# Patient Record
Sex: Female | Born: 1965 | Race: Black or African American | Hispanic: No | Smoking: Current some day smoker
Health system: Southern US, Community
[De-identification: ages and names within clinical notes are randomized; demographics above are authoritative.]

## PROBLEM LIST (undated history)

## (undated) DIAGNOSIS — R7303 Prediabetes: Secondary | ICD-10-CM

## (undated) DIAGNOSIS — M1711 Unilateral primary osteoarthritis, right knee: Secondary | ICD-10-CM

## (undated) DIAGNOSIS — R519 Headache, unspecified: Secondary | ICD-10-CM

## (undated) DIAGNOSIS — M2550 Pain in unspecified joint: Secondary | ICD-10-CM

## (undated) DIAGNOSIS — R51 Headache: Secondary | ICD-10-CM

## (undated) DIAGNOSIS — I10 Essential (primary) hypertension: Secondary | ICD-10-CM

## (undated) HISTORY — PX: NECK SURGERY: SHX720

## (undated) HISTORY — PX: CHOLECYSTECTOMY: SHX55

## (undated) HISTORY — PX: COLONOSCOPY: SHX174

## (undated) HISTORY — PX: BACK SURGERY: SHX140

## (undated) HISTORY — PX: OOPHORECTOMY: SHX86

## (undated) HISTORY — PX: ABDOMINAL HYSTERECTOMY: SHX81

---

## 2005-01-06 ENCOUNTER — Ambulatory Visit: Payer: Self-pay | Admitting: Family Medicine

## 2005-03-15 ENCOUNTER — Encounter: Payer: Self-pay | Admitting: Orthopedic Surgery

## 2005-04-13 ENCOUNTER — Encounter: Payer: Self-pay | Admitting: Orthopedic Surgery

## 2005-05-13 ENCOUNTER — Encounter: Payer: Self-pay | Admitting: Orthopedic Surgery

## 2006-02-27 ENCOUNTER — Ambulatory Visit: Payer: Self-pay | Admitting: Family Medicine

## 2007-04-04 ENCOUNTER — Ambulatory Visit: Payer: Self-pay | Admitting: Family Medicine

## 2008-04-06 ENCOUNTER — Ambulatory Visit: Payer: Self-pay | Admitting: Family Medicine

## 2008-04-10 ENCOUNTER — Ambulatory Visit: Payer: Self-pay | Admitting: Obstetrics and Gynecology

## 2008-04-16 ENCOUNTER — Inpatient Hospital Stay: Payer: Self-pay | Admitting: Obstetrics and Gynecology

## 2009-04-08 ENCOUNTER — Ambulatory Visit: Payer: Self-pay | Admitting: Family Medicine

## 2010-04-27 ENCOUNTER — Ambulatory Visit: Payer: Self-pay | Admitting: Family Medicine

## 2011-05-30 ENCOUNTER — Ambulatory Visit: Payer: Self-pay | Admitting: Family Medicine

## 2012-06-06 ENCOUNTER — Ambulatory Visit: Payer: Self-pay | Admitting: Family Medicine

## 2012-08-15 ENCOUNTER — Ambulatory Visit: Payer: Self-pay | Admitting: Orthopedic Surgery

## 2012-10-09 ENCOUNTER — Ambulatory Visit: Payer: Self-pay | Admitting: Orthopedic Surgery

## 2013-06-10 ENCOUNTER — Ambulatory Visit: Payer: Self-pay | Admitting: Family Medicine

## 2013-06-23 ENCOUNTER — Ambulatory Visit: Payer: Self-pay | Admitting: Family Medicine

## 2014-02-06 ENCOUNTER — Ambulatory Visit: Payer: Self-pay | Admitting: Family Medicine

## 2014-06-18 ENCOUNTER — Ambulatory Visit: Payer: Self-pay | Admitting: Family Medicine

## 2014-07-15 ENCOUNTER — Ambulatory Visit: Payer: Self-pay | Admitting: Orthopedic Surgery

## 2015-05-12 ENCOUNTER — Other Ambulatory Visit: Payer: Self-pay | Admitting: Family Medicine

## 2015-05-12 DIAGNOSIS — Z1231 Encounter for screening mammogram for malignant neoplasm of breast: Secondary | ICD-10-CM

## 2015-06-21 ENCOUNTER — Ambulatory Visit
Admission: RE | Admit: 2015-06-21 | Discharge: 2015-06-21 | Disposition: A | Discharge status: Home / Self Care | Payer: BC Managed Care – PPO | Source: Ambulatory Visit | Attending: Family Medicine | Admitting: Family Medicine

## 2015-06-21 DIAGNOSIS — Z1231 Encounter for screening mammogram for malignant neoplasm of breast: Secondary | ICD-10-CM | POA: Diagnosis present

## 2016-04-05 ENCOUNTER — Ambulatory Visit
Admission: RE | Admit: 2016-04-05 | Discharge: 2016-04-05 | Disposition: A | Discharge status: Home / Self Care | Payer: BC Managed Care – PPO | Source: Ambulatory Visit | Attending: Orthopedic Surgery | Admitting: Orthopedic Surgery

## 2016-04-05 ENCOUNTER — Other Ambulatory Visit: Payer: Self-pay | Admitting: Orthopedic Surgery

## 2016-04-05 DIAGNOSIS — G8929 Other chronic pain: Secondary | ICD-10-CM | POA: Diagnosis not present

## 2016-04-05 DIAGNOSIS — M25561 Pain in right knee: Secondary | ICD-10-CM | POA: Diagnosis not present

## 2016-04-05 DIAGNOSIS — M25562 Pain in left knee: Secondary | ICD-10-CM | POA: Insufficient documentation

## 2016-04-05 DIAGNOSIS — M25569 Pain in unspecified knee: Secondary | ICD-10-CM

## 2016-04-05 DIAGNOSIS — M1711 Unilateral primary osteoarthritis, right knee: Secondary | ICD-10-CM | POA: Insufficient documentation

## 2016-05-03 ENCOUNTER — Other Ambulatory Visit: Payer: Self-pay | Admitting: Orthopedic Surgery

## 2016-05-03 DIAGNOSIS — M1711 Unilateral primary osteoarthritis, right knee: Secondary | ICD-10-CM

## 2016-05-25 ENCOUNTER — Ambulatory Visit: Payer: BC Managed Care – PPO

## 2016-05-26 ENCOUNTER — Ambulatory Visit
Admission: RE | Admit: 2016-05-26 | Discharge: 2016-05-26 | Disposition: A | Discharge status: Home / Self Care | Payer: BC Managed Care – PPO | Source: Ambulatory Visit | Attending: Orthopedic Surgery | Admitting: Orthopedic Surgery

## 2016-05-26 DIAGNOSIS — M1711 Unilateral primary osteoarthritis, right knee: Secondary | ICD-10-CM | POA: Insufficient documentation

## 2016-05-26 DIAGNOSIS — M7121 Synovial cyst of popliteal space [Baker], right knee: Secondary | ICD-10-CM | POA: Diagnosis not present

## 2016-06-05 ENCOUNTER — Other Ambulatory Visit: Payer: Self-pay | Admitting: Family Medicine

## 2016-06-05 DIAGNOSIS — Z1231 Encounter for screening mammogram for malignant neoplasm of breast: Secondary | ICD-10-CM

## 2016-06-21 ENCOUNTER — Ambulatory Visit: Payer: BC Managed Care – PPO

## 2016-06-27 ENCOUNTER — Encounter: Payer: Self-pay | Admitting: Physician Assistant

## 2016-06-27 DIAGNOSIS — M1711 Unilateral primary osteoarthritis, right knee: Secondary | ICD-10-CM | POA: Diagnosis present

## 2016-06-27 DIAGNOSIS — I10 Essential (primary) hypertension: Secondary | ICD-10-CM | POA: Diagnosis present

## 2016-06-27 NOTE — H&P (Signed)
TOTAL KNEE ADMISSION H&P  Patient is being admitted for right total knee arthroplasty.  Subjective:  Chief Complaint:right knee pain.  HPI: Carol Dunn, 50 y.o. female, has a history of pain and functional disability in the right knee due to arthritis and has failed non-surgical conservative treatments for greater than 12 weeks to includeNSAID's and/or analgesics, corticosteriod injections, viscosupplementation injections, flexibility and strengthening excercises, supervised PT with diminished ADL's post treatment, use of assistive devices and activity modification.  Onset of symptoms was gradual, starting 10 years ago with gradually worsening course since that time. The patient noted no past surgery on the right knee(s).  Patient currently rates pain in the right knee(s) at 10 out of 10 with activity. Patient has night pain, worsening of pain with activity and weight bearing, pain that interferes with activities of daily living, crepitus and joint swelling.  Patient has evidence of subchondral sclerosis, periarticular osteophytes and joint space narrowing by imaging studies. There is no active infection.  Patient Active Problem List   Diagnosis Date Noted  . Hypertension   . Primary localized osteoarthritis of right knee    Past Medical History:  Diagnosis Date  . Hypertension   . Primary localized osteoarthritis of right knee     No past surgical history on file.  No prescriptions prior to admission.   No Known Allergies  Social History  Substance Use Topics  . Smoking status: Not on file  . Smokeless tobacco: Not on file  . Alcohol use Not on file    Family History  Problem Relation Age of Onset  . Breast cancer Sister     two sisters one at age 52 and 77other38  . Breast cancer Maternal Aunt 39     Review of Systems  Constitutional: Negative.   HENT: Negative.   Eyes: Negative.   Cardiovascular: Negative.   Gastrointestinal: Negative.   Genitourinary: Negative.    Musculoskeletal: Positive for back pain and joint pain.  Skin: Negative.   Neurological: Negative.   Endo/Heme/Allergies: Negative.     Objective:  Physical Exam  Constitutional: She is oriented to person, place, and time. She appears well-developed and well-nourished.  HENT:  Head: Normocephalic and atraumatic.  Mouth/Throat: Oropharynx is clear and moist.  Eyes: Conjunctivae are normal. Pupils are equal, round, and reactive to light.  Cardiovascular: Normal rate and regular rhythm.   Respiratory: Effort normal and breath sounds normal.  Genitourinary:  Genitourinary Comments: Not pertinent to current symptomatology therefore not examined.  Musculoskeletal:  Examination of her right knee reveals pain medially and laterally.  1+ effusion.  Full range of motion.  Knee is stable with normal patella tracking.  Examination of her left knee reveals full range of motion without pain, swelling, weakness or instability.    Neurological: She is alert and oriented to person, place, and time.  Skin: Skin is warm and dry.  Psychiatric: She has a normal mood and affect. Her behavior is normal.    Vital signs in last 24 hours: Temp:  [98.3 F (36.8 C)] 98.3 F (36.8 C) (08/15 1200) Pulse Rate:  [85] 85 (08/15 1200) BP: (135)/(80) 135/80 (08/15 1200) SpO2:  [99 %] 99 % (08/15 1200) Weight:  [78 kg (172 lb)] 78 kg (172 lb) (08/15 1200)  Labs:   Estimated body mass index is 31.46 kg/m as calculated from the following:   Height as of this encounter: 5\' 2"  (1.575 m).   Weight as of this encounter: 78 kg (172 lb).  Imaging Review Plain radiographs demonstrate severe degenerative joint disease of the right knee(s). The overall alignment issignificant varus. The bone quality appears to be good for age and reported activity level.  Assessment/Plan:  End stage arthritis, right knee  Active Problems:   Hypertension   Primary localized osteoarthritis of right knee   The patient  history, physical examination, clinical judgment of the provider and imaging studies are consistent with end stage degenerative joint disease of the right knee(s) and total knee arthroplasty is deemed medically necessary. The treatment options including medical management, injection therapy arthroscopy and arthroplasty were discussed at length. The risks and benefits of total knee arthroplasty were presented and reviewed. The risks due to aseptic loosening, infection, stiffness, patella tracking problems, thromboembolic complications and other imponderables were discussed. The patient acknowledged the explanation, agreed to proceed with the plan and consent was signed. Patient is being admitted for inpatient treatment for surgery, pain control, PT, OT, prophylactic antibiotics, VTE prophylaxis, progressive ambulation and ADL's and discharge planning. The patient is planning to be discharged home with home health services

## 2016-06-28 ENCOUNTER — Other Ambulatory Visit: Payer: Self-pay | Admitting: Family Medicine

## 2016-06-28 ENCOUNTER — Ambulatory Visit
Admission: RE | Admit: 2016-06-28 | Discharge: 2016-06-28 | Disposition: A | Discharge status: Home / Self Care | Payer: BC Managed Care – PPO | Source: Ambulatory Visit | Attending: Family Medicine | Admitting: Family Medicine

## 2016-06-28 DIAGNOSIS — Z1231 Encounter for screening mammogram for malignant neoplasm of breast: Secondary | ICD-10-CM | POA: Diagnosis not present

## 2016-06-30 ENCOUNTER — Encounter (HOSPITAL_COMMUNITY): Payer: Self-pay

## 2016-06-30 ENCOUNTER — Encounter (HOSPITAL_COMMUNITY)
Admission: RE | Admit: 2016-06-30 | Discharge: 2016-06-30 | Disposition: A | Discharge status: Home / Self Care | Payer: BC Managed Care – PPO | Source: Ambulatory Visit | Attending: Orthopedic Surgery | Admitting: Orthopedic Surgery

## 2016-06-30 DIAGNOSIS — Z01812 Encounter for preprocedural laboratory examination: Secondary | ICD-10-CM | POA: Diagnosis not present

## 2016-06-30 DIAGNOSIS — Z01818 Encounter for other preprocedural examination: Secondary | ICD-10-CM | POA: Insufficient documentation

## 2016-06-30 DIAGNOSIS — Z0183 Encounter for blood typing: Secondary | ICD-10-CM | POA: Diagnosis not present

## 2016-06-30 DIAGNOSIS — M1711 Unilateral primary osteoarthritis, right knee: Secondary | ICD-10-CM | POA: Diagnosis not present

## 2016-06-30 HISTORY — DX: Pain in unspecified joint: M25.50

## 2016-06-30 HISTORY — DX: Headache: R51

## 2016-06-30 HISTORY — DX: Headache, unspecified: R51.9

## 2016-06-30 LAB — COMPREHENSIVE METABOLIC PANEL
ALBUMIN: 3.7 g/dL (ref 3.5–5.0)
ALK PHOS: 75 U/L (ref 38–126)
ALT: 16 U/L (ref 14–54)
AST: 15 U/L (ref 15–41)
Anion gap: 6 (ref 5–15)
BILIRUBIN TOTAL: 0.4 mg/dL (ref 0.3–1.2)
BUN: 16 mg/dL (ref 6–20)
CO2: 25 mmol/L (ref 22–32)
Calcium: 9.1 mg/dL (ref 8.9–10.3)
Chloride: 106 mmol/L (ref 101–111)
Creatinine, Ser: 0.78 mg/dL (ref 0.44–1.00)
GFR calc Af Amer: >60 mL/min (ref >60)
Glucose, Bld: 86 mg/dL (ref 65–99)
Potassium: 3.4 mmol/L — ABNORMAL LOW (ref 3.5–5.1)
Sodium: 137 mmol/L (ref 135–145)
Total Protein: 7.2 g/dL (ref 6.5–8.1)

## 2016-06-30 LAB — TYPE AND SCREEN
ABO/RH(D): B POS
Antibody Screen: NEGATIVE

## 2016-06-30 LAB — CBC WITH DIFFERENTIAL/PLATELET
BASOS ABS: 0.1 10*3/uL (ref 0.0–0.1)
BASOS PCT: 1 %
Eosinophils Absolute: 0.1 10*3/uL (ref 0.0–0.7)
Eosinophils Relative: 1 %
HEMATOCRIT: 38.7 % (ref 36.0–46.0)
HEMOGLOBIN: 13.2 g/dL (ref 12.0–15.0)
LYMPHS PCT: 33 %
Lymphs Abs: 3.2 10*3/uL (ref 0.7–4.0)
MCH: 31.3 pg (ref 26.0–34.0)
MCHC: 34.1 g/dL (ref 30.0–36.0)
MCV: 91.7 fL (ref 78.0–100.0)
MONO ABS: 1.3 10*3/uL — AB (ref 0.1–1.0)
Monocytes Relative: 13 %
NEUTROS ABS: 5 10*3/uL (ref 1.7–7.7)
NEUTROS PCT: 52 %
Platelets: 258 10*3/uL (ref 150–400)
RBC: 4.22 MIL/uL (ref 3.87–5.11)
RDW: 13.3 % (ref 11.5–15.5)
WBC: 9.6 10*3/uL (ref 4.0–10.5)

## 2016-06-30 LAB — PROTIME-INR
INR: 1.02
Prothrombin Time: 13.4 seconds (ref 11.4–15.2)

## 2016-06-30 LAB — APTT: APTT: 23 s — AB (ref 24–36)

## 2016-06-30 LAB — ABO/RH: ABO/RH(D): B POS

## 2016-06-30 LAB — SURGICAL PCR SCREEN
MRSA, PCR: NEGATIVE
STAPHYLOCOCCUS AUREUS: NEGATIVE

## 2016-06-30 MED ORDER — CHLORHEXIDINE GLUCONATE 4 % EX LIQD: 60.0000 mL | Freq: Once | CUTANEOUS | Status: DC

## 2016-06-30 NOTE — Pre-Procedure Instructions (Signed)
Hurley CiscoJosie G Laverne  06/30/2016      [id number] - [address] [facility] [place] [facility] [place] [address] [place] [id number] Phone: [phone] Fax: [phone]    Your procedure is scheduled on Mon, Aug 28 @ 10:15 AM  [facility] at 8:15 AM  Call this number if you have problems the morning of surgery:  [phone]   Remember:  Do not eat food or drink liquids after midnight.              A week prior to surgery stop taking your Vitamins,Ibuprofen,any Herbal Medications. No Goody's,BC's,Aleve,Advil,Motrin,or Fish Oil.    Do not wear jewelry, make-up or nail polish.  Do not wear lotions, powders, or perfumes.    Do not shave 48 hours prior to surgery.    Do not bring valuables to the hospital.  [facility] is not responsible for any belongings or valuables.  Contacts, dentures or bridgework may not be worn into surgery.  Leave your suitcase in the car.  After surgery it may be brought to your room.  For patients admitted to the hospital, discharge time will be determined by your treatment team.  Patients discharged the day of surgery will not be allowed to drive home.    [facility]  Before surgery, you can play an important role.  Because skin is not sterile, your skin needs to be as free of germs as possible.  You can reduce the number of germs on you skin by washing with CHG (chlorahexidine gluconate) soap before surgery.  CHG is an antiseptic cleaner which kills germs and bonds with the skin to continue killing germs even after washing.  Please DO NOT use if you have an allergy to CHG or antibacterial soaps.  If your skin becomes reddened/irritated stop using the CHG and inform your nurse when you arrive at Short Stay.  Do not shave (including legs and underarms) for at least 48 hours prior to the first CHG shower.  You may shave your face.  Please follow these  instructions carefully:   1.  Shower with CHG Soap the night before surgery and the                                morning of Surgery.  2.  If you choose to wash your hair, wash your hair first as usual with your       normal shampoo.  3.  After you shampoo, rinse your hair and body thoroughly to remove the                      Shampoo.  4.  Use CHG as you would any other liquid soap.  You can apply chg directly       to the skin and wash gently with scrungie or a clean washcloth.  5.  Apply the CHG Soap to your body ONLY FROM THE NECK DOWN.        Do not use on open wounds or open sores.  Avoid contact with your eyes,       ears, mouth and genitals (private parts).  Wash genitals (private parts)       with your normal soap.  6.  Wash thoroughly, paying special attention to the area where your surgery  will be performed.  7.  Thoroughly rinse your body with warm water from the neck down.  8.  DO NOT shower/wash with your normal soap after using and rinsing off       the CHG Soap.  9.  Pat yourself dry with a clean towel.            10.  Wear clean pajamas.            11.  Place clean sheets on your bed the night of your first shower and do not        sleep with pets.  Day of Surgery  Do not apply any lotions/deoderants the morning of surgery.  Please wear clean clothes to the hospital/surgery center.    Please read over the following fact sheets that you were given. Pain Booklet, Coughing and Deep Breathing and MRSA Information

## 2016-06-30 NOTE — Progress Notes (Addendum)
Cardiologist denies  [facility]  Echo denies  Stress test denies  Heart cath denies  EKG denies in past yr  CXR denies

## 2016-07-01 LAB — URINE CULTURE

## 2016-07-07 MED ORDER — CEFAZOLIN SODIUM-DEXTROSE 2-4 GM/100ML-% IV SOLN
2.0000 g | INTRAVENOUS | Status: AC
  Administered 2016-07-10: 2 g via INTRAVENOUS
  Filled 2016-07-07: qty 100

## 2016-07-07 MED ORDER — LACTATED RINGERS IV SOLN
INTRAVENOUS | Status: DC
  Administered 2016-07-10 (×2): via INTRAVENOUS

## 2016-07-10 ENCOUNTER — Inpatient Hospital Stay (HOSPITAL_COMMUNITY): Payer: BC Managed Care – PPO | Admitting: Anesthesiology

## 2016-07-10 ENCOUNTER — Encounter (HOSPITAL_COMMUNITY)
Admission: RE | Disposition: A | Discharge status: Home Health | Payer: Self-pay | Source: Ambulatory Visit | Attending: Orthopedic Surgery

## 2016-07-10 ENCOUNTER — Encounter (HOSPITAL_COMMUNITY): Payer: Self-pay | Admitting: *Deleted

## 2016-07-10 ENCOUNTER — Inpatient Hospital Stay (HOSPITAL_COMMUNITY)
Admission: RE | Admit: 2016-07-10 | Discharge: 2016-07-11 | DRG: 470 | Disposition: A | Discharge status: Home Health | Payer: BC Managed Care – PPO | Source: Ambulatory Visit | Attending: Orthopedic Surgery | Admitting: Orthopedic Surgery

## 2016-07-10 DIAGNOSIS — E669 Obesity, unspecified: Secondary | ICD-10-CM | POA: Diagnosis present

## 2016-07-10 DIAGNOSIS — M1711 Unilateral primary osteoarthritis, right knee: Secondary | ICD-10-CM | POA: Diagnosis present

## 2016-07-10 DIAGNOSIS — Z6831 Body mass index (BMI) 31.0-31.9, adult: Secondary | ICD-10-CM | POA: Diagnosis not present

## 2016-07-10 DIAGNOSIS — Z79899 Other long term (current) drug therapy: Secondary | ICD-10-CM | POA: Diagnosis not present

## 2016-07-10 DIAGNOSIS — Z87891 Personal history of nicotine dependence: Secondary | ICD-10-CM | POA: Diagnosis not present

## 2016-07-10 DIAGNOSIS — I10 Essential (primary) hypertension: Secondary | ICD-10-CM | POA: Diagnosis present

## 2016-07-10 DIAGNOSIS — M25561 Pain in right knee: Secondary | ICD-10-CM | POA: Diagnosis present

## 2016-07-10 HISTORY — DX: Essential (primary) hypertension: I10

## 2016-07-10 HISTORY — DX: Unilateral primary osteoarthritis, right knee: M17.11

## 2016-07-10 HISTORY — PX: JOINT REPLACEMENT: SHX530

## 2016-07-10 HISTORY — PX: TOTAL KNEE ARTHROPLASTY: SHX125

## 2016-07-10 SURGERY — ARTHROPLASTY, KNEE, TOTAL
Anesthesia: Spinal | Site: Knee | Laterality: Right

## 2016-07-10 MED ORDER — SODIUM CHLORIDE 0.9 % IR SOLN
Status: DC | PRN
  Administered 2016-07-10: 3000 mL

## 2016-07-10 MED ORDER — OXYCODONE HCL 5 MG PO TABS
5.0000 mg | ORAL_TABLET | ORAL | Status: DC | PRN
  Administered 2016-07-10 – 2016-07-11 (×5): 10 mg via ORAL
  Filled 2016-07-10 (×5): qty 2

## 2016-07-10 MED ORDER — ROCURONIUM BROMIDE 10 MG/ML (PF) SYRINGE
PREFILLED_SYRINGE | INTRAVENOUS | Status: AC
  Filled 2016-07-10: qty 10

## 2016-07-10 MED ORDER — ONDANSETRON HCL 4 MG PO TABS: 4.0000 mg | ORAL_TABLET | Freq: Four times a day (QID) | ORAL | Status: DC | PRN

## 2016-07-10 MED ORDER — PHENOL 1.4 % MT LIQD: 1.0000 | OROMUCOSAL | Status: DC | PRN

## 2016-07-10 MED ORDER — DEXAMETHASONE SODIUM PHOSPHATE 10 MG/ML IJ SOLN
10.0000 mg | Freq: Three times a day (TID) | INTRAMUSCULAR | Status: DC
  Administered 2016-07-10 – 2016-07-11 (×3): 10 mg via INTRAVENOUS
  Filled 2016-07-10 (×3): qty 1

## 2016-07-10 MED ORDER — PHENYLEPHRINE 40 MCG/ML (10ML) SYRINGE FOR IV PUSH (FOR BLOOD PRESSURE SUPPORT)
PREFILLED_SYRINGE | INTRAVENOUS | Status: AC
  Filled 2016-07-10: qty 10

## 2016-07-10 MED ORDER — PROPOFOL 10 MG/ML IV BOLUS
INTRAVENOUS | Status: AC
  Filled 2016-07-10: qty 20

## 2016-07-10 MED ORDER — CELECOXIB 200 MG PO CAPS
200.0000 mg | ORAL_CAPSULE | Freq: Two times a day (BID) | ORAL | Status: DC
  Administered 2016-07-10 – 2016-07-11 (×3): 200 mg via ORAL
  Filled 2016-07-10 (×3): qty 1

## 2016-07-10 MED ORDER — EPHEDRINE 5 MG/ML INJ
INTRAVENOUS | Status: AC
  Filled 2016-07-10: qty 10

## 2016-07-10 MED ORDER — MENTHOL 3 MG MT LOZG: 1.0000 | LOZENGE | OROMUCOSAL | Status: DC | PRN

## 2016-07-10 MED ORDER — FENTANYL CITRATE (PF) 100 MCG/2ML IJ SOLN
INTRAMUSCULAR | Status: DC | PRN
  Administered 2016-07-10: 100 ug via INTRAVENOUS

## 2016-07-10 MED ORDER — APIXABAN 2.5 MG PO TABS
2.5000 mg | ORAL_TABLET | Freq: Two times a day (BID) | ORAL | Status: DC
  Administered 2016-07-11: 2.5 mg via ORAL
  Filled 2016-07-10: qty 1

## 2016-07-10 MED ORDER — ACETAMINOPHEN 650 MG RE SUPP: 650.0000 mg | Freq: Four times a day (QID) | RECTAL | Status: DC | PRN

## 2016-07-10 MED ORDER — MEPERIDINE HCL 25 MG/ML IJ SOLN: 6.2500 mg | INTRAMUSCULAR | Status: DC | PRN

## 2016-07-10 MED ORDER — CHOLECALCIFEROL 25 MCG (1000 UT) PO TABS: 1.0000 | ORAL_TABLET | Freq: Every day | ORAL | Status: DC

## 2016-07-10 MED ORDER — SUCCINYLCHOLINE CHLORIDE 200 MG/10ML IV SOSY
PREFILLED_SYRINGE | INTRAVENOUS | Status: AC
  Filled 2016-07-10: qty 10

## 2016-07-10 MED ORDER — BUPIVACAINE-EPINEPHRINE (PF) 0.5% -1:200000 IJ SOLN
INTRAMUSCULAR | Status: DC | PRN
  Administered 2016-07-10: 30 mL via PERINEURAL

## 2016-07-10 MED ORDER — DIPHENHYDRAMINE HCL 12.5 MG/5ML PO ELIX: 12.5000 mg | ORAL_SOLUTION | ORAL | Status: DC | PRN

## 2016-07-10 MED ORDER — ONDANSETRON HCL 4 MG/2ML IJ SOLN
INTRAMUSCULAR | Status: AC
  Filled 2016-07-10: qty 2

## 2016-07-10 MED ORDER — DEXAMETHASONE SODIUM PHOSPHATE 10 MG/ML IJ SOLN
INTRAMUSCULAR | Status: AC
  Filled 2016-07-10: qty 1

## 2016-07-10 MED ORDER — POTASSIUM CHLORIDE IN NACL 20-0.9 MEQ/L-% IV SOLN
INTRAVENOUS | Status: DC
  Administered 2016-07-10: 15:00:00 via INTRAVENOUS
  Administered 2016-07-11: 100 mL/h via INTRAVENOUS
  Filled 2016-07-10 (×2): qty 1000

## 2016-07-10 MED ORDER — MIDAZOLAM HCL 5 MG/5ML IJ SOLN
INTRAMUSCULAR | Status: DC | PRN
  Administered 2016-07-10: 2 mg via INTRAVENOUS

## 2016-07-10 MED ORDER — ALUM & MAG HYDROXIDE-SIMETH 200-200-20 MG/5ML PO SUSP: 30.0000 mL | ORAL | Status: DC | PRN

## 2016-07-10 MED ORDER — HYDROMORPHONE HCL 1 MG/ML IJ SOLN: 0.2500 mg | INTRAMUSCULAR | Status: DC | PRN

## 2016-07-10 MED ORDER — METOCLOPRAMIDE HCL 5 MG/ML IJ SOLN: 5.0000 mg | Freq: Three times a day (TID) | INTRAMUSCULAR | Status: DC | PRN

## 2016-07-10 MED ORDER — DEXAMETHASONE SODIUM PHOSPHATE 10 MG/ML IJ SOLN
INTRAMUSCULAR | Status: DC | PRN
  Administered 2016-07-10: 10 mg via INTRAVENOUS

## 2016-07-10 MED ORDER — PROPOFOL 10 MG/ML IV BOLUS
INTRAVENOUS | Status: DC | PRN
  Administered 2016-07-10: 10 mg via INTRAVENOUS
  Administered 2016-07-10 (×2): 20 mg via INTRAVENOUS

## 2016-07-10 MED ORDER — VALACYCLOVIR HCL 500 MG PO TABS: 500.0000 mg | ORAL_TABLET | ORAL | Status: DC | PRN

## 2016-07-10 MED ORDER — MIDAZOLAM HCL 2 MG/2ML IJ SOLN: 0.5000 mg | Freq: Once | INTRAMUSCULAR | Status: DC | PRN

## 2016-07-10 MED ORDER — HYDROMORPHONE HCL 1 MG/ML IJ SOLN
1.0000 mg | INTRAMUSCULAR | Status: DC | PRN
  Administered 2016-07-10 – 2016-07-11 (×2): 1 mg via INTRAVENOUS
  Filled 2016-07-10 (×2): qty 1

## 2016-07-10 MED ORDER — PROPOFOL 500 MG/50ML IV EMUL
INTRAVENOUS | Status: DC | PRN
  Administered 2016-07-10: 40 ug/kg/min via INTRAVENOUS

## 2016-07-10 MED ORDER — BUPIVACAINE IN DEXTROSE 0.75-8.25 % IT SOLN
INTRATHECAL | Status: DC | PRN
  Administered 2016-07-10: 12 mg via INTRATHECAL

## 2016-07-10 MED ORDER — POVIDONE-IODINE 7.5 % EX SOLN
Freq: Once | CUTANEOUS | Status: DC
  Filled 2016-07-10: qty 118

## 2016-07-10 MED ORDER — LIDOCAINE 2% (20 MG/ML) 5 ML SYRINGE
INTRAMUSCULAR | Status: AC
  Filled 2016-07-10: qty 5

## 2016-07-10 MED ORDER — PROMETHAZINE HCL 25 MG/ML IJ SOLN: 6.2500 mg | INTRAMUSCULAR | Status: DC | PRN

## 2016-07-10 MED ORDER — LIDOCAINE HCL (CARDIAC) 20 MG/ML IV SOLN
INTRAVENOUS | Status: DC | PRN
  Administered 2016-07-10: 60 mg via INTRAVENOUS

## 2016-07-10 MED ORDER — ONDANSETRON HCL 4 MG/2ML IJ SOLN: 4.0000 mg | Freq: Four times a day (QID) | INTRAMUSCULAR | Status: DC | PRN

## 2016-07-10 MED ORDER — FENTANYL CITRATE (PF) 100 MCG/2ML IJ SOLN
INTRAMUSCULAR | Status: DC
Start: 2016-07-10 — End: 2016-07-10
  Filled 2016-07-10: qty 2

## 2016-07-10 MED ORDER — VITAMIN D 1000 UNITS PO TABS
1000.0000 [IU] | ORAL_TABLET | Freq: Every day | ORAL | Status: DC
  Administered 2016-07-10 – 2016-07-11 (×2): 1000 [IU] via ORAL
  Filled 2016-07-10 (×2): qty 1

## 2016-07-10 MED ORDER — BUPIVACAINE-EPINEPHRINE 0.25% -1:200000 IJ SOLN
INTRAMUSCULAR | Status: DC | PRN
  Administered 2016-07-10: 30 mL

## 2016-07-10 MED ORDER — BUPIVACAINE-EPINEPHRINE (PF) 0.25% -1:200000 IJ SOLN
INTRAMUSCULAR | Status: AC
  Filled 2016-07-10: qty 30

## 2016-07-10 MED ORDER — CEFAZOLIN SODIUM-DEXTROSE 2-4 GM/100ML-% IV SOLN
2.0000 g | Freq: Four times a day (QID) | INTRAVENOUS | Status: AC
  Administered 2016-07-10 (×2): 2 g via INTRAVENOUS
  Filled 2016-07-10 (×2): qty 100

## 2016-07-10 MED ORDER — FENTANYL CITRATE (PF) 100 MCG/2ML IJ SOLN
INTRAMUSCULAR | Status: AC
Start: 2016-07-10 — End: 2016-07-10
  Filled 2016-07-10: qty 2

## 2016-07-10 MED ORDER — MIDAZOLAM HCL 2 MG/2ML IJ SOLN
INTRAMUSCULAR | Status: AC
  Filled 2016-07-10: qty 2

## 2016-07-10 MED ORDER — LISINOPRIL 10 MG PO TABS: 10.0000 mg | ORAL_TABLET | Freq: Every day | ORAL | Status: DC

## 2016-07-10 MED ORDER — ACETAMINOPHEN 325 MG PO TABS
650.0000 mg | ORAL_TABLET | Freq: Four times a day (QID) | ORAL | Status: DC | PRN
  Administered 2016-07-10: 650 mg via ORAL
  Filled 2016-07-10: qty 2

## 2016-07-10 MED ORDER — METOCLOPRAMIDE HCL 5 MG PO TABS: 5.0000 mg | ORAL_TABLET | Freq: Three times a day (TID) | ORAL | Status: DC | PRN

## 2016-07-10 MED ORDER — POLYETHYLENE GLYCOL 3350 17 G PO PACK
17.0000 g | PACK | Freq: Two times a day (BID) | ORAL | Status: DC
  Administered 2016-07-10 – 2016-07-11 (×3): 17 g via ORAL
  Filled 2016-07-10 (×3): qty 1

## 2016-07-10 MED ORDER — DOCUSATE SODIUM 100 MG PO CAPS
100.0000 mg | ORAL_CAPSULE | Freq: Two times a day (BID) | ORAL | Status: DC
  Administered 2016-07-10 – 2016-07-11 (×3): 100 mg via ORAL
  Filled 2016-07-10 (×3): qty 1

## 2016-07-10 MED ORDER — 0.9 % SODIUM CHLORIDE (POUR BTL) OPTIME
TOPICAL | Status: DC | PRN
  Administered 2016-07-10: 1000 mL

## 2016-07-10 SURGICAL SUPPLY — 66 items
BANDAGE ESMARK 6X9 LF (GAUZE/BANDAGES/DRESSINGS) ×1 IMPLANT
BENZOIN TINCTURE PRP APPL 2/3 (GAUZE/BANDAGES/DRESSINGS) ×2 IMPLANT
BLADE SAGITTAL 25.0X1.19X90 (BLADE) ×2 IMPLANT
BLADE SAW SGTL 13X75X1.27 (BLADE) ×2 IMPLANT
BLADE SURG 10 STRL SS (BLADE) ×4 IMPLANT
BNDG COHESIVE 6X5 TAN STRL LF (GAUZE/BANDAGES/DRESSINGS) ×2 IMPLANT
BNDG ELASTIC 6X15 VLCR STRL LF (GAUZE/BANDAGES/DRESSINGS) ×2 IMPLANT
BNDG ESMARK 6X9 LF (GAUZE/BANDAGES/DRESSINGS) ×2
BOWL SMART MIX CTS (DISPOSABLE) ×2 IMPLANT
CAPT KNEE TOTAL 3 ATTUNE ×2 IMPLANT
CEMENT HV SMART SET (Cement) ×4 IMPLANT
CLSR STERI-STRIP ANTIMIC 1/2X4 (GAUZE/BANDAGES/DRESSINGS) ×2 IMPLANT
COVER SURGICAL LIGHT HANDLE (MISCELLANEOUS) ×2 IMPLANT
CUFF TOURNIQUET SINGLE 34IN LL (TOURNIQUET CUFF) ×2 IMPLANT
CUFF TOURNIQUET SINGLE 44IN (TOURNIQUET CUFF) IMPLANT
DECANTER SPIKE VIAL GLASS SM (MISCELLANEOUS) ×2 IMPLANT
DRAPE EXTREMITY T 121X128X90 (DRAPE) ×2 IMPLANT
DRAPE INCISE IOBAN 66X45 STRL (DRAPES) ×2 IMPLANT
DRAPE PROXIMA HALF (DRAPES) ×4 IMPLANT
DRAPE U-SHAPE 47X51 STRL (DRAPES) ×2 IMPLANT
DRSG AQUACEL AG ADV 3.5X14 (GAUZE/BANDAGES/DRESSINGS) ×2 IMPLANT
DURAPREP 26ML APPLICATOR (WOUND CARE) ×4 IMPLANT
ELECT CAUTERY BLADE 6.4 (BLADE) ×2 IMPLANT
ELECT REM PT RETURN 9FT ADLT (ELECTROSURGICAL) ×2
ELECTRODE REM PT RTRN 9FT ADLT (ELECTROSURGICAL) ×1 IMPLANT
FACESHIELD WRAPAROUND (MASK) ×2 IMPLANT
GLOVE BIO SURGEON STRL SZ7 (GLOVE) ×2 IMPLANT
GLOVE BIOGEL PI IND STRL 7.0 (GLOVE) ×1 IMPLANT
GLOVE BIOGEL PI IND STRL 7.5 (GLOVE) ×1 IMPLANT
GLOVE BIOGEL PI INDICATOR 7.0 (GLOVE) ×1
GLOVE BIOGEL PI INDICATOR 7.5 (GLOVE) ×1
GLOVE SS BIOGEL STRL SZ 7.5 (GLOVE) ×1 IMPLANT
GLOVE SUPERSENSE BIOGEL SZ 7.5 (GLOVE) ×1
GOWN STRL REUS W/ TWL LRG LVL3 (GOWN DISPOSABLE) ×1 IMPLANT
GOWN STRL REUS W/ TWL XL LVL3 (GOWN DISPOSABLE) ×2 IMPLANT
GOWN STRL REUS W/TWL LRG LVL3 (GOWN DISPOSABLE) ×1
GOWN STRL REUS W/TWL XL LVL3 (GOWN DISPOSABLE) ×2
HANDPIECE INTERPULSE COAX TIP (DISPOSABLE) ×1
HOOD PEEL AWAY FACE SHEILD DIS (HOOD) ×4 IMPLANT
IMMOBILIZER KNEE 22 (SOFTGOODS) ×2 IMPLANT
IMMOBILIZER KNEE 22 UNIV (SOFTGOODS) ×2 IMPLANT
KIT BASIN OR (CUSTOM PROCEDURE TRAY) ×2 IMPLANT
KIT ROOM TURNOVER OR (KITS) ×2 IMPLANT
MANIFOLD NEPTUNE II (INSTRUMENTS) ×2 IMPLANT
MARKER SKIN DUAL TIP RULER LAB (MISCELLANEOUS) ×2 IMPLANT
NEEDLE 18GX1X1/2 (RX/OR ONLY) (NEEDLE) ×2 IMPLANT
NS IRRIG 1000ML POUR BTL (IV SOLUTION) ×2 IMPLANT
PACK TOTAL JOINT (CUSTOM PROCEDURE TRAY) ×2 IMPLANT
PAD ARMBOARD 7.5X6 YLW CONV (MISCELLANEOUS) ×4 IMPLANT
SET HNDPC FAN SPRY TIP SCT (DISPOSABLE) ×1 IMPLANT
STRIP CLOSURE SKIN 1/2X4 (GAUZE/BANDAGES/DRESSINGS) ×2 IMPLANT
SUCTION FRAZIER HANDLE 10FR (MISCELLANEOUS) ×1
SUCTION TUBE FRAZIER 10FR DISP (MISCELLANEOUS) ×1 IMPLANT
SUT MNCRL AB 3-0 PS2 18 (SUTURE) ×2 IMPLANT
SUT VIC AB 0 CT1 27 (SUTURE) ×3
SUT VIC AB 0 CT1 27XBRD ANBCTR (SUTURE) ×3 IMPLANT
SUT VIC AB 1 CT1 27 (SUTURE) ×2
SUT VIC AB 1 CT1 27XBRD ANBCTR (SUTURE) ×2 IMPLANT
SUT VIC AB 2-0 CT1 27 (SUTURE) ×2
SUT VIC AB 2-0 CT1 TAPERPNT 27 (SUTURE) ×2 IMPLANT
SYR 30ML LL (SYRINGE) ×2 IMPLANT
TOWEL OR 17X24 6PK STRL BLUE (TOWEL DISPOSABLE) ×2 IMPLANT
TOWEL OR 17X26 10 PK STRL BLUE (TOWEL DISPOSABLE) ×2 IMPLANT
TRAY FOLEY CATH 16FR SILVER (SET/KITS/TRAYS/PACK) ×2 IMPLANT
TUBE CONNECTING 12X1/4 (SUCTIONS) ×2 IMPLANT
YANKAUER SUCT BULB TIP NO VENT (SUCTIONS) ×2 IMPLANT

## 2016-07-10 NOTE — Anesthesia Procedure Notes (Signed)
Date/Time: 07/10/2016 9:34 AM Performed by: Coralee RudFLORES, Shama Monfils Pre-anesthesia Checklist: Patient identified, Emergency Drugs available, Suction available and Patient being monitored Patient Re-evaluated:Patient Re-evaluated prior to inductionOxygen Delivery Method: Nasal cannula Preoxygenation: Natural airway.

## 2016-07-10 NOTE — Interval H&P Note (Signed)
History and Physical Interval Note:  07/10/2016 7:08 AM  Carol Dunn  has presented today for surgery, with the diagnosis of Primary localized OA right knee  The various methods of treatment have been discussed with the patient and family. After consideration of risks, benefits and other options for treatment, the patient has consented to  Procedure(s): TOTAL KNEE ARTHROPLASTY (Right) as a surgical intervention .  The patient's history has been reviewed, patient examined, no change in status, stable for surgery.  I have reviewed the patient's chart and labs.  Questions were answered to the patient's satisfaction.     Salvatore MarvelWAINER,Paublo Warshawsky A

## 2016-07-10 NOTE — Transfer of Care (Signed)
Immediate Anesthesia Transfer of Care Note  Patient: Hurley CiscoJosie G Weese  Procedure(s) Performed: Procedure(s): TOTAL KNEE ARTHROPLASTY (Right)  Patient Location: PACU  Anesthesia Type:Spinal  Level of Consciousness: awake, alert , oriented and patient cooperative  Airway & Oxygen Therapy: Patient Spontanous Breathing and Patient connected to nasal cannula oxygen  Post-op Assessment: Report given to RN and Post -op Vital signs reviewed and stable  Post vital signs: Reviewed and stable  Last Vitals:  Vitals:   07/10/16 0913 07/10/16 0914  BP:    Pulse: 91 93  Resp: 20 17  Temp:      Last Pain:  Vitals:   07/10/16 0736  TempSrc: Oral  PainSc: 7          Complications: No apparent anesthesia complications

## 2016-07-10 NOTE — Progress Notes (Signed)
Orthopedic Tech Progress Note Patient Details:  Hurley CiscoJosie G Schindler 10/21/1966 [id number]  CPM Right Knee CPM Right Knee: On Right Knee Flexion (Degrees): 9 Right Knee Extension (Degrees): 0 Additional Comments: trapeze bar patient helper   Nikki DomCrawford, Kewon Statler 07/10/2016, 12:20 PM Viewed order from doctor's order list

## 2016-07-10 NOTE — Anesthesia Preprocedure Evaluation (Addendum)
Anesthesia Evaluation  Patient identified by MRN, date of birth, ID band Patient awake    Reviewed: Allergy & Precautions, NPO status , Patient's Chart, lab work & pertinent test results  History of Anesthesia Complications Negative for: history of anesthetic complications  Airway Mallampati: I  TM Distance: >3 FB Neck ROM: Full    Dental  (+) Dental Advisory Given   Pulmonary former smoker,    breath sounds clear to auscultation       Cardiovascular hypertension, Pt. on medications (-) angina Rhythm:Regular Rate:Normal     Neuro/Psych negative neurological ROS     GI/Hepatic negative GI ROS, Neg liver ROS,   Endo/Other  Morbid obesity  Renal/GU negative Renal ROS     Musculoskeletal  (+) Arthritis , Osteoarthritis,    Abdominal (+) + obese,   Peds  Hematology negative hematology ROS (+)   Anesthesia Other Findings   Reproductive/Obstetrics                            Anesthesia Physical Anesthesia Plan  ASA: II  Anesthesia Plan: Spinal   Post-op Pain Management:  Regional for Post-op pain   Induction:   Airway Management Planned: Natural Airway and Simple Face Mask  Additional Equipment:   Intra-op Plan:   Post-operative Plan:   Informed Consent: I have reviewed the patients History and Physical, chart, labs and discussed the procedure including the risks, benefits and alternatives for the proposed anesthesia with the patient or authorized representative who has indicated his/her understanding and acceptance.   Dental advisory given  Plan Discussed with: CRNA and Surgeon  Anesthesia Plan Comments: (Plan routine monitors, SAB with adductor canal block for post op analgesia)        Anesthesia Quick Evaluation

## 2016-07-10 NOTE — Op Note (Signed)
MRN:     [id number] DOB/AGE:    1966/01/06 / 50 y.o.       OPERATIVE REPORT    DATE OF PROCEDURE:  07/10/2016       PREOPERATIVE DIAGNOSIS:   Primary localized OA right knee      Estimated body mass index is 31.46 kg/m as calculated from the following:   Height as of this encounter: 5\' 2"  (1.575 m).   Weight as of this encounter: 78 kg (172 lb).                                                        POSTOPERATIVE DIAGNOSIS:   same                                                                      PROCEDURE:  Procedure(s): TOTAL KNEE ARTHROPLASTY Using Depuy Attune RP implants #5 narrow Femur, #4Tibia, 6mm  RP bearing, 29 Patella     SURGEON: Abir Eroh A    ASSISTANT:  Kirstin Shepperson PA-C   (Present and scrubbed throughout the case, critical for assistance with exposure, retraction, instrumentation, and closure.)         ANESTHESIA: Spinal with Adductor Nerve Block     TOURNIQUET TIME:   COMPLICATIONS:  None     SPECIMENS: None   INDICATIONS FOR PROCEDURE: The patient has  degenerative joint disease right knee, varus deformities, XR shows bone on bone arthritis. Patient has failed all conservative measures including anti-inflammatory medicines, narcotics, attempts at  exercise and weight loss, cortisone injections and viscosupplementation.  Risks and benefits of surgery have been discussed, questions answered.   DESCRIPTION OF PROCEDURE: The patient identified by armband, received  right femoral nerve block and IV antibiotics, in the holding area at [facility]. Patient taken to the operating room, appropriate anesthetic  monitors were attached General endotracheal anesthesia induced with  the patient in supine position, Foley catheter was inserted. Tourniquet  applied high to the operative thigh. Lateral post and foot positioner  applied to the table, the lower extremity was then prepped and draped  in usual sterile fashion from the ankle to the tourniquet.  Time-out procedure was performed. The limb was wrapped with an Esmarch bandage and the tourniquet inflated to 365 mmHg. We began the operation by making the anterior midline incision starting at handbreadth above the patella going over the patella 1 cm medial to and  4 cm distal to the tibial tubercle. Small bleeders in the skin and the  subcutaneous tissue identified and cauterized. Transverse retinaculum was incised and reflected medially and a medial parapatellar arthrotomy was accomplished. the patella was everted and theprepatellar fat pad resected. The superficial medial collateral  ligament was then elevated from anterior to posterior along the proximal  flare of the tibia and anterior half of the menisci resected. The knee was hyperflexed exposing bone on bone arthritis. Peripheral and notch osteophytes as well as the cruciate ligaments were then resected. We continued to  work our way around posteriorly along the proximal tibia, and externally  rotated the  tibia subluxing it out from underneath the femur. A McHale  retractor was placed through the notch and a lateral Hohmann retractor  placed, and we then drilled through the proximal tibia in line with the  axis of the tibia followed by an intramedullary guide rod and 2-degree  posterior slope cutting guide. The tibial cutting guide was pinned into place  allowing resection of 4 mm of bone medially and about 6 mm of bone  laterally because of her varus deformity. Satisfied with the tibial resection, we then  entered the distal femur 2 mm anterior to the PCL origin with the  intramedullary guide rod and applied the distal femoral cutting guide  set at 11mm, with 5 degrees of valgus. This was pinned along the  epicondylar axis. At this point, the distal femoral cut was accomplished without difficulty. We then sized for a #5 narrow femoral component and pinned the guide in 3 degrees of external rotation.The chamfer cutting guide was pinned into  place. The anterior, posterior, and chamfer cuts were accomplished without difficulty followed by  the  RP box cutting guide and the box cut. We also removed posterior osteophytes from the posterior femoral condyles. At this  time, the knee was brought into full extension. We checked our  extension and flexion gaps and found them symmetric at 6mm.  The patella thickness measured at 21 mm. We set the cutting guide at 12 and removed the posterior 8 mm  of the patella sized for 29 button and drilled the lollipop. The knee  was then once again hyperflexed exposing the proximal tibia. We sized for a #4 tibial base plate, applied the smokestack and the conical reamer followed by the the Delta fin keel punch. We then hammered into place the  RP trial femoral component, inserted a 1 trial bearing, trial patellar button, and took the knee through range of motion from 0-130 degrees. No thumb pressure was required for patellar  tracking. At this point, all trial components were removed, a double batch of DePuy HV cement was mixed and applied to all bony metallic mating surfaces except for the posterior condyles of the femur itself. In order, we  hammered into place the tibial tray and removed excess cement, the femoral component and removed excess cement, a 6mm  RP bearing  was inserted, and the knee brought to full extension with compression.  The patellar button was clamped into place, and excess cement  removed. While the cement cured the wound was irrigated out with normal saline solution pulse lavage.. Ligament stability and patellar tracking were checked and found to be excellent.. The parapatellar arthrotomy was closed with  #1 Vicryl suture. The subcutaneous tissue with 0 and 2-0 undyed  Vicryl suture, and 4-0 Monocryl.. A dressing of Aquaseal,  4 x 4, dressing sponges, Webril, and Ace wrap applied. Needle and sponge count were correct times 2.The patient awakened, extubated, and taken to recovery room  without difficulty. Vascular status was normal, pulses 2+ and symmetric.   Drevin Ortner A 07/10/2016, 11:00 AM

## 2016-07-10 NOTE — Anesthesia Postprocedure Evaluation (Signed)
Anesthesia Post Note  Patient: Carol Dunn  Procedure(s) Performed: Procedure(s) (LRB): TOTAL KNEE ARTHROPLASTY (Right)  Patient location during evaluation: PACU Anesthesia Type: Spinal and Regional Level of consciousness: awake and alert, patient cooperative and oriented Pain management: pain level controlled Vital Signs Assessment: post-procedure vital signs reviewed and stable Respiratory status: spontaneous breathing, nonlabored ventilation, respiratory function stable and patient connected to nasal cannula oxygen Cardiovascular status: blood pressure returned to baseline and stable Postop Assessment: no signs of nausea or vomiting, patient able to bend at knees and spinal receding Anesthetic complications: no    Last Vitals:  Vitals:   07/10/16 1245 07/10/16 1315  BP: 126/87   Pulse: 84 86  Resp: 14 18  Temp:      Last Pain:  Vitals:   07/10/16 1245  TempSrc:   PainSc: 0-No pain                 Christine Morton,E. Chloe Baig

## 2016-07-10 NOTE — Anesthesia Procedure Notes (Signed)
Spinal  Patient location during procedure: OR End time: 07/10/2016 9:30 AM Staffing Anesthesiologist: Jairo BenJACKSON, [name] Performed: anesthesiologist  Preanesthetic Checklist Completed: patient identified, site marked, surgical consent, pre-op evaluation, timeout performed, IV checked, risks and benefits discussed and monitors and equipment checked Spinal Block Patient position: sitting Prep: Betadine, ChloraPrep and site prepped and draped Patient monitoring: heart rate, cardiac monitor, continuous pulse ox and blood pressure Approach: midline Location: L3-4 Injection technique: single-shot Needle Needle type: Quincke  Needle gauge: 25 G Needle length: 9 cm Additional Notes Pt identified in Operating room.  Monitors applied. Working IV access confirmed. Sterile prep, drape lumbar spine.  1% lido local L 3,4.  #25ga Quincke into clear CSF L 3,4.  12mg  0.75% Bupivacaine with dextrose injected with asp CSF beginning and end of injection.  Patient asymptomatic, VSS, no heme aspirated, tolerated well.  Carol Craze Basha Krygier, MD

## 2016-07-10 NOTE — Evaluation (Signed)
Physical Therapy Evaluation Patient Details Name: Carol Dunn MRN: [id number] DOB: 01/26/1966 Today's Date: 07/10/2016   History of Present Illness  Patient is a 50 y/o female with hx of HTN and headache presents s/p rt TKA.   Clinical Impression  Patient presents with pain and post surgical deficits RLE s/p Rt TKA. Tolerated transfers and gait training with min guard assist for safety. 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 Education re: precautions, zero degree knee, exercises and positioning. Pt will have support from ex husband at home. Will follow acutely to maximize independence and mobility prior to return home. Will plan for stair training tomorrow as tolerated prior to d/c.    Follow Up Recommendations Home health PT;Supervision for mobility/OOB    Equipment Recommendations  None recommended by PT    Recommendations for Other Services OT consult     Precautions / Restrictions Precautions Precautions: Knee Precaution Booklet Issued: No Precaution Comments: Reviewed no pillow under knee and precautions Required Braces or Orthoses: Knee Immobilizer - Right Knee Immobilizer - Right: On when out of bed or walking Restrictions Weight Bearing Restrictions: Yes RLE Weight Bearing: Weight bearing as tolerated      Mobility  Bed Mobility Overal bed mobility: Needs Assistance Bed Mobility: Supine to Sit     Supine to sit: Supervision;HOB elevated     General bed mobility comments: Use of rail, no assist needed, increased time.  Transfers Overall transfer level: Needs assistance Equipment used: Rolling walker (2 wheeled) Transfers: Sit to/from Stand Sit to Stand: Min guard         General transfer comment: Min guard for safety. No dizziness. Transferred to chair post ambulation bout.  Ambulation/Gait Ambulation/Gait assistance: Min guard Ambulation Distance (Feet): 18 Feet Assistive device: Rolling walker (2 wheeled) Gait Pattern/deviations:  Step-through pattern;Decreased stance time - right;Decreased step length - left Gait velocity: decreased Gait velocity interpretation: Below normal speed for age/gender General Gait Details: Slow, mostly steady gait with increased pain through RLE with weightbearing. Cues for step through gait.  Stairs            Wheelchair Mobility    Modified Rankin (Stroke Patients Only)       Balance Overall balance assessment: Needs assistance Sitting-balance support: Feet supported;No upper extremity supported Sitting balance-Leahy Scale: Good     Standing balance support: During functional activity Standing balance-Leahy Scale: Poor Standing balance comment: Reliant on BUEs for support.                             Pertinent Vitals/Pain Pain Assessment: 0-10 Pain Score: 8  Pain Location: right knee with mobility Pain Descriptors / Indicators: Sore Pain Intervention(s): Limited activity within patient's tolerance;Premedicated before session;Repositioned    Home Living Family/patient expects to be discharged to:: Private residence Living Arrangements: Spouse/significant other;Children Available Help at Discharge: Family;Available 24 hours/day Type of Home: House Home Access: Stairs to enter Entrance Stairs-Rails: Right Entrance Stairs-Number of Steps: 5 Home Layout: One level Home Equipment: Walker - 2 wheels;Bedside commode      Prior Function Level of Independence: Independent         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      Hand Dominance        Extremity/Trunk Assessment   Upper Extremity Assessment: Defer to OT evaluation           Lower Extremity Assessment: RLE deficits/detail RLE Deficits / Details: Limited  AROM/strength secondary to pain/surgery       Communication   Communication: No difficulties  Cognition Arousal/Alertness: Awake/alert Behavior During Therapy: WFL for tasks assessed/performed Overall  Cognitive Status: Within Functional Limits for tasks assessed                      General Comments General comments (skin integrity, edema, etc.): Family present during session.    Exercises Total Joint Exercises Ankle Circles/Pumps: Both;10 reps;Supine Quad Sets: Both;10 reps;Supine Gluteal Sets: Both;10 reps;Supine      Assessment/Plan    PT Assessment Patient needs continued PT services  PT Diagnosis Difficulty walking;Acute pain   PT Problem List Decreased strength;Decreased mobility;Decreased knowledge of precautions;Decreased range of motion;Decreased activity tolerance;Pain;Impaired sensation;Decreased balance  PT Treatment Interventions DME instruction;Therapeutic activities;Gait training;Therapeutic exercise;Patient/family education;Stair training;Balance training;Functional mobility training   PT Goals (Current goals can be found in the Care Plan section) Acute Rehab PT Goals Patient Stated Goal: to get back to work PT Goal Formulation: With patient Time For Goal Achievement: 07/24/16 Potential to Achieve Goals: Good    Frequency 7X/week   Barriers to discharge Inaccessible home environment stairs to enter home    Co-evaluation               End of Session Equipment Utilized During Treatment: Gait belt Activity Tolerance: Patient tolerated treatment well;Patient limited by pain Patient left: in chair;with call bell/phone within reach;with family/visitor present Nurse Communication: Mobility status         Time: 1535-1600 PT Time Calculation (min) (ACUTE ONLY): 25 min   Charges:   PT Evaluation $PT Eval Low Complexity: 1 Procedure PT Treatments $Gait Training: 8-22 mins   PT G Codes:        Hira Trent A Shelsie Tijerino 07/10/2016, 4:08 PM Mylo Red, PT, DPT [phone]

## 2016-07-10 NOTE — Anesthesia Procedure Notes (Signed)
Anesthesia Regional Block:  Adductor canal block  Pre-Anesthetic Checklist: ,, timeout performed, Correct Patient, Correct Site, Correct Laterality, Correct Procedure, Correct Position, site marked, Risks and benefits discussed,  Surgical consent,  Pre-op evaluation,  At surgeon's request and post-op pain management  Laterality: Right and Lower  Prep: chloraprep       Needles:  Injection technique: Single-shot  Needle Type: Echogenic Needle     Needle Length: 9cm 9 cm Needle Gauge: 22 and 22 G    Additional Needles:  Procedures: ultrasound guided (picture in chart) Adductor canal block Narrative:  Start time: 07/10/2016 8:15 AM End time: 07/10/2016 8:24 AM Injection made incrementally with aspirations every 5 mL.  Performed by: Personally  Anesthesiologist: Jean RosenthalJACKSON, Lucillia Corson  Additional Notes: Pt identified in Holding room.  Monitors applied. Working IV access confirmed. Sterile prep, drape R thigh.  #22ga ECHOgenic needle into adductor canal with US guidance.  30cc 0.5% Bupivacaine with 1:200k epi injected incrementally after negative test dose.  Patient asymptomatic, VSS, no heme aspirated, tolerated well.  Sandford Craze Tressia Labrum, MD

## 2016-07-11 ENCOUNTER — Encounter (HOSPITAL_COMMUNITY): Payer: Self-pay | Admitting: Orthopedic Surgery

## 2016-07-11 LAB — CBC
HEMATOCRIT: 29.7 % — AB (ref 36.0–46.0)
HEMOGLOBIN: 9.9 g/dL — AB (ref 12.0–15.0)
MCH: 30.6 pg (ref 26.0–34.0)
MCHC: 33.3 g/dL (ref 30.0–36.0)
MCV: 91.7 fL (ref 78.0–100.0)
PLATELETS: 233 10*3/uL (ref 150–400)
RBC: 3.24 MIL/uL — AB (ref 3.87–5.11)
RDW: 13.2 % (ref 11.5–15.5)
WBC: 22.1 10*3/uL — AB (ref 4.0–10.5)

## 2016-07-11 LAB — CBC WITH DIFFERENTIAL/PLATELET
Basophils Absolute: 0 10*3/uL (ref 0.0–0.1)
Basophils Relative: 0 %
EOS PCT: 0 %
Eosinophils Absolute: 0 10*3/uL (ref 0.0–0.7)
HEMATOCRIT: 29.5 % — AB (ref 36.0–46.0)
HEMOGLOBIN: 9.9 g/dL — AB (ref 12.0–15.0)
LYMPHS PCT: 4 %
Lymphs Abs: 1.1 10*3/uL (ref 0.7–4.0)
MCH: 30.5 pg (ref 26.0–34.0)
MCHC: 33.6 g/dL (ref 30.0–36.0)
MCV: 90.8 fL (ref 78.0–100.0)
MONOS PCT: 6 %
Monocytes Absolute: 1.6 10*3/uL — ABNORMAL HIGH (ref 0.1–1.0)
NEUTROS ABS: 23.6 10*3/uL — AB (ref 1.7–7.7)
Neutrophils Relative %: 90 %
Platelets: 231 10*3/uL (ref 150–400)
RBC: 3.25 MIL/uL — AB (ref 3.87–5.11)
RDW: 13.1 % (ref 11.5–15.5)
WBC: 26.3 10*3/uL — ABNORMAL HIGH (ref 4.0–10.5)

## 2016-07-11 LAB — BASIC METABOLIC PANEL
ANION GAP: 7 (ref 5–15)
BUN: 9 mg/dL (ref 6–20)
CHLORIDE: 107 mmol/L (ref 101–111)
CO2: 22 mmol/L (ref 22–32)
Calcium: 8.3 mg/dL — ABNORMAL LOW (ref 8.9–10.3)
Creatinine, Ser: 0.69 mg/dL (ref 0.44–1.00)
GFR calc Af Amer: >60 mL/min (ref >60)
GLUCOSE: 149 mg/dL — AB (ref 65–99)
POTASSIUM: 4.1 mmol/L (ref 3.5–5.1)
Sodium: 136 mmol/L (ref 135–145)

## 2016-07-11 MED ORDER — CEFUROXIME AXETIL 500 MG PO TABS: 500.0000 mg | ORAL_TABLET | Freq: Two times a day (BID) | ORAL | 0 refills | Status: DC

## 2016-07-11 MED ORDER — DEXTROSE 5 % IV SOLN
1.0000 g | INTRAVENOUS | Status: DC
  Administered 2016-07-11: 1 g via INTRAVENOUS
  Filled 2016-07-11: qty 10

## 2016-07-11 MED ORDER — APIXABAN 2.5 MG PO TABS: 2.5000 mg | ORAL_TABLET | Freq: Two times a day (BID) | ORAL | 0 refills | Status: DC

## 2016-07-11 MED ORDER — OXYCODONE HCL 5 MG PO TABS: ORAL_TABLET | ORAL | 0 refills | Status: DC

## 2016-07-11 MED ORDER — POLYETHYLENE GLYCOL 3350 17 G PO PACK: PACK | ORAL | 0 refills | Status: DC

## 2016-07-11 MED ORDER — DOCUSATE SODIUM 100 MG PO CAPS: ORAL_CAPSULE | ORAL | 0 refills | Status: DC

## 2016-07-11 NOTE — Discharge Summary (Signed)
Patient ID: Carol Dunn MRN: [id number] DOB/AGE: 52/01/1966 52 y.o.  Admit date: 07/10/2016 Discharge date: 07/11/2016  Admission Diagnoses:  Principal Problem:   Primary localized osteoarthritis of right knee Active Problems:   Hypertension   Discharge Diagnoses:  Same  Past Medical History:  Diagnosis Date  . Headache   . Hypertension    takes Lisinopril daily  . Joint pain   . Primary localized osteoarthritis of right knee     Surgeries: Procedure(s): TOTAL KNEE ARTHROPLASTY on 07/10/2016   Consultants:   Discharged Condition: Improved  Hospital Course: Carol Dunn is an 50 y.o. female who was admitted 07/10/2016 for operative treatment ofPrimary localized osteoarthritis of right knee. Patient has severe unremitting pain that affects sleep, daily activities, and work/hobbies. After pre-op clearance the patient was taken to the operating room on 07/10/2016 and underwent  Procedure(s): TOTAL KNEE ARTHROPLASTY.    Patient was given perioperative antibiotics: Anti-infectives    Start     Dose/Rate Route Frequency Ordered Stop   07/11/16 0900  cefTRIAXone (ROCEPHIN) 1 g in dextrose 5 % 50 mL IVPB     1 g 100 mL/hr over 30 Minutes Intravenous Every 24 hours 07/11/16 0754     07/11/16 0000  cefUROXime (CEFTIN) 500 MG tablet     500 mg Oral 2 times daily with meals 07/11/16 0849     07/10/16 1500  ceFAZolin (ANCEF) IVPB 2g/100 mL premix     2 g 200 mL/hr over 30 Minutes Intravenous Every 6 hours 07/10/16 1154 07/10/16 2140   07/10/16 1349  valACYclovir (VALTREX) tablet 500 mg     500 mg Oral As needed 07/10/16 1349     07/10/16 0800  ceFAZolin (ANCEF) IVPB 2g/100 mL premix     2 g 200 mL/hr over 30 Minutes Intravenous To [facility] 07/07/16 2017 07/10/16 0930       Patient was given sequential compression devices, early ambulation, and chemoprophylaxis to prevent DVT.  Patient benefited maximally from hospital stay and there were no complications.     Recent vital signs: Patient Vitals for the past 24 hrs:  BP Temp Temp src Pulse Resp SpO2  07/11/16 0517 120/72 97.7 F (36.5 C) Oral 73 16 100 %  07/11/16 0137 126/88 97.9 F (36.6 C) Oral 74 16 99 %  07/10/16 2021 108/76 97.7 F (36.5 C) Oral 80 - 99 %  07/10/16 1439 (!) 152/92 97.9 F (36.6 C) Oral 90 - 100 %  07/10/16 1330 - 97.4 F (36.3 C) - - - -  07/10/16 1315 (!) 145/96 - - 86 18 100 %  07/10/16 1300 137/87 - - - - -  07/10/16 1245 126/87 - - 84 14 100 %  07/10/16 1230 (!) 138/94 - - 82 15 100 %  07/10/16 1215 (!) 133/91 - - 90 (!) 23 100 %  07/10/16 1200 121/90 - - 88 17 100 %  07/10/16 1145 125/86 - - 87 (!) 21 100 %  07/10/16 1134 118/76 97 F (36.1 C) - 88 16 100 %  07/10/16 0914 - - - 93 17 100 %  07/10/16 0913 - - - 91 20 100 %  07/10/16 0912 136/77 - - 92 (!) 22 100 %  07/10/16 0911 - - - 92 19 100 %  07/10/16 0910 - - - 92 (!) 22 100 %  07/10/16 0909 138/80 - - 92 (!) 21 100 %  07/10/16 0908 - - - 91 16 100 %  07/10/16 0907 - - -  97 20 100 %  07/10/16 0906 138/75 - - 94 16 100 %  07/10/16 0905 - - - 95 15 100 %  07/10/16 0904 - - - 95 17 100 %  07/10/16 0903 129/80 - - 97 (!) 24 100 %  07/10/16 0902 - - - 98 (!) 29 100 %  07/10/16 0901 - - - 90 (!) 23 100 %  07/10/16 0900 134/69 - - 89 19 100 %     Recent laboratory studies:  Recent Labs  07/11/16 0500  WBC 22.1*  HGB 9.9*  HCT 29.7*  PLT 233  NA 136  K 4.1  CL 107  CO2 22  BUN 9  CREATININE 0.69  GLUCOSE 149*  CALCIUM 8.3*     Discharge Medications:     Medication List    STOP taking these medications   GOODSENSE IBUPROFEN 200 MG tablet Generic drug:  ibuprofen   meloxicam 15 MG tablet Commonly known as:  MOBIC     TAKE these medications   apixaban 2.5 MG Tabs tablet Commonly known as:  ELIQUIS Take 1 tablet (2.5 mg total) by mouth every 12 (twelve) hours.   B-12 2500 MCG Tabs Take 2,500 mcg by mouth daily.   cefUROXime 500 MG tablet Commonly known as:  CEFTIN Take 1  tablet (500 mg total) by mouth 2 (two) times daily with a meal.   docusate sodium 100 MG capsule Commonly known as:  COLACE 1 tab 2 times a day while on narcotics.  STOOL SOFTENER   lisinopril 10 MG tablet Commonly known as:  PRINIVIL,ZESTRIL Take 10 mg by mouth daily.   ONE-A-DAY WOMENS PO Take 1 tablet by mouth daily.   oxyCODONE 5 MG immediate release tablet Commonly known as:  Oxy IR/ROXICODONE 1-2 tablets every 4-6 hrs as needed for pain   polyethylene glycol packet Commonly known as:  MIRALAX / GLYCOLAX 17grams in 6 oz of water twice a day until bowel movement.  LAXITIVE.  Restart if two days since last bowel movement   valACYclovir 500 MG tablet Commonly known as:  VALTREX Take 1 tablet by mouth as needed (outbreak).   VITAMIN D-1000 MAX ST 1000 units tablet Generic drug:  Cholecalciferol Take 1 tablet by mouth daily.       Diagnostic Studies: Mm Screening Breast Tomo Bilateral  Result Date: 06/28/2016 CLINICAL DATA:  Screening. EXAM: 2D DIGITAL SCREENING BILATERAL MAMMOGRAM WITH CAD AND ADJUNCT TOMO COMPARISON:  Previous exam(s). ACR Breast Density Category c: The breast tissue is heterogeneously dense, which may obscure small masses. FINDINGS: There are no findings suspicious for malignancy. Images were processed with CAD. IMPRESSION: No mammographic evidence of malignancy. A result letter of this screening mammogram will be mailed directly to the patient. RECOMMENDATION: Screening mammogram in one year. (Code:SM-B-01Y) BI-RADS CATEGORY  1: Negative. Electronically Signed   By: Dalphine Handing M.D.   On: 06/28/2016 17:58    Disposition:   Discharge Instructions    CPM    Complete by:  As directed   Continuous passive motion machine (CPM):      Use the CPM from 0 to 90 for 6 hours per day.       You may break it up into 2 or 3 sessions per day.      Use CPM for 2 weeks or until you are told to stop.   Call MD / Call 911    Complete by:  As directed   If you experience  chest pain or shortness  of breath, CALL 911 and be transported to the hospital emergency room.  If you develope a fever above 101 F, pus (white drainage) or increased drainage or redness at the wound, or calf pain, call your surgeon's office.   Change dressing    Complete by:  As directed   Change the gauze dressing daily with sterile 4 x 4 inch gauze and apply TED hose.  DO NOT REMOVE BANDAGE OVER SURGICAL INCISION.  WASH WHOLE LEG INCLUDING OVER THE WATERPROOF BANDAGE WITH SOAP AND WATER EVERY DAY.   Constipation Prevention    Complete by:  As directed   Drink plenty of fluids.  Prune juice may be helpful.  You may use a stool softener, such as Colace (over the counter) 100 mg twice a day.  Use MiraLax (over the counter) for constipation as needed.   Diet - low sodium heart healthy    Complete by:  As directed   Discharge instructions    Complete by:  As directed   INSTRUCTIONS AFTER JOINT REPLACEMENT   Remove items at home which could result in a fall. This includes throw rugs or furniture in walking pathways ICE to the affected joint every three hours while awake for 30 minutes at a time, for at least the first 3-5 days, and then as needed for pain and swelling.  Continue to use ice for pain and swelling. You may notice swelling that will progress down to the foot and ankle.  This is normal after surgery.  Elevate your leg when you are not up walking on it.   Continue to use the breathing machine you got in the hospital (incentive spirometer) which will help keep your temperature down.  It is common for your temperature to cycle up and down following surgery, especially at night when you are not up moving around and exerting yourself.  The breathing machine keeps your lungs expanded and your temperature down.   DIET:  As you were doing prior to hospitalization, we recommend a well-balanced diet.  DRESSING / WOUND CARE / SHOWERING  Keep the surgical dressing until follow up.  The dressing is  water proof, so you can shower without any extra covering.  IF THE DRESSING FALLS OFF or the wound gets wet inside, change the dressing with sterile gauze.  Please use good hand washing techniques before changing the dressing.  Do not use any lotions or creams on the incision until instructed by your surgeon.    ACTIVITY  Increase activity slowly as tolerated, but follow the weight bearing instructions below.   No driving for 6 weeks or until further direction given by your physician.  You cannot drive while taking narcotics.  No lifting or carrying greater than 10 lbs. until further directed by your surgeon. Avoid periods of inactivity such as sitting longer than an hour when not asleep. This helps prevent blood clots.  You may return to work once you are authorized by your doctor.     WEIGHT BEARING   Weight bearing as tolerated with assist device (walker, cane, etc) as directed, use it as long as suggested by your surgeon or therapist, typically at least 2-3 weeks.   EXERCISES  Results after joint replacement surgery are often greatly improved when you follow the exercise, range of motion and muscle strengthening exercises prescribed by your doctor. Safety measures are also important to protect the joint from further injury. Any time any of these exercises cause you to have increased pain or swelling, decrease  what you are doing until you are comfortable again and then slowly increase them. If you have problems or questions, call your caregiver or physical therapist for advice.   Rehabilitation is important following a joint replacement. After just a few days of immobilization, the muscles of the leg can become weakened and shrink (atrophy).  These exercises are designed to build up the tone and strength of the thigh and leg muscles and to improve motion. Often times heat used for twenty to thirty minutes before working out will loosen up your tissues and help with improving the range of  motion but do not use heat for the first two weeks following surgery (sometimes heat can increase post-operative swelling).   These exercises can be done on a training (exercise) mat, on the floor, on a table or on a bed. Use whatever works the best and is most comfortable for you.    Use music or television while you are exercising so that the exercises are a pleasant break in your day. This will make your life better with the exercises acting as a break in your routine that you can look forward to.   Perform all exercises about fifteen times, three times per day or as directed.  You should exercise both the operative leg and the other leg as well.   Exercises include:  Quad Sets - Tighten up the muscle on the front of the thigh (Quad) and hold for 5-10 seconds.   Straight Leg Raises - With your knee straight (if you were given a brace, keep it on), lift the leg to 60 degrees, hold for 3 seconds, and slowly lower the leg.  Perform this exercise against resistance later as your leg gets stronger.  Leg Slides: Lying on your back, slowly slide your foot toward your buttocks, bending your knee up off the floor (only go as far as is comfortable). Then slowly slide your foot back down until your leg is flat on the floor again.  Angel Wings: Lying on your back spread your legs to the side as far apart as you can without causing discomfort.  Hamstring Strength:  Lying on your back, push your heel against the floor with your leg straight by tightening up the muscles of your buttocks.  Repeat, but this time bend your knee to a comfortable angle, and push your heel against the floor.  You may put a pillow under the heel to make it more comfortable if necessary.   A rehabilitation program following joint replacement surgery can speed recovery and prevent re-injury in the future due to weakened muscles. Contact your doctor or a physical therapist for more information on knee rehabilitation.     CONSTIPATION  Constipation is defined medically as fewer than three stools per week and severe constipation as less than one stool per week.  Even if you have a regular bowel pattern at home, your normal regimen is likely to be disrupted due to multiple reasons following surgery.  Combination of anesthesia, postoperative narcotics, change in appetite and fluid intake all can affect your bowels.   YOU MUST use at least one of the following options; they are listed in order of increasing strength to get the job done.  They are all available over the counter, and you may need to use some, POSSIBLY even all of these options:    Drink plenty of fluids (prune juice may be helpful) and high fiber foods Colace 100 mg by mouth twice a day  Senokot  for constipation as directed and as needed Dulcolax (bisacodyl), take with full glass of water  Miralax (polyethylene glycol) once or twice a day as needed.  If you have tried all these things and are unable to have a bowel movement in the first 3-4 days after surgery call either your surgeon or your primary doctor.    If you experience loose stools or diarrhea, hold the medications until you stool forms back up.  If your symptoms do not get better within 1 week or if they get worse, check with your doctor.  If you experience "the worst abdominal pain ever" or develop nausea or vomiting, please contact the office immediately for further recommendations for treatment.   ITCHING:  If you experience itching with your medications, try taking only a single pain pill, or even half a pain pill at a time.  You can also use Benadryl over the counter for itching or also to help with sleep.   TED HOSE STOCKINGS:  Use stockings on both legs until for at least 2 weeks or as directed by physician office. They may be removed at night for sleeping.  MEDICATIONS:  See your medication summary on the "After Visit Summary" that nursing will review with you.  You may have some  home medications which will be placed on hold until you complete the course of blood thinner medication.  It is important for you to complete the blood thinner medication as prescribed.  PRECAUTIONS:  If you experience chest pain or shortness of breath - call 911 immediately for transfer to the hospital emergency department.   If you develop a fever greater that 101 F, purulent drainage from wound, increased redness or drainage from wound, foul odor from the wound/dressing, or calf pain - CONTACT YOUR SURGEON.                                                   FOLLOW-UP APPOINTMENTS:  If you do not already have a post-op appointment, please call the office for an appointment to be seen by your surgeon.  Guidelines for how soon to be seen are listed in your "After Visit Summary", but are typically between 1-4 weeks after surgery.  OTHER INSTRUCTIONS:   Knee Replacement:  Do not place pillow under knee, focus on keeping the knee straight while resting. CPM instructions: 0-90 degrees, 2 hours in the morning, 2 hours in the afternoon, and 2 hours in the evening. Place foam block, curve side up under heel at all times except when in CPM or when walking.  DO NOT modify, tear, cut, or change the foam block in any way.  MAKE SURE YOU:  Understand these instructions.  Get help right away if you are not doing well or get worse.    Thank you for letting us be a part of your medical care team.  It is a privilege we respect greatly.  We hope these instructions will help you stay on track for a fast and full recovery!   Do not put a pillow under the knee. Place it under the heel.    Complete by:  As directed   Place gray foam block, curve side up under heel at all times except when in CPM or when walking.  DO NOT modify, tear, cut, or change in any way the gray foam block.  Increase activity slowly as tolerated    Complete by:  As directed   Patient may shower    Complete by:  As directed   Aquacel dressing  is water proof    Wash over it and the whole leg with soap and water at the end of your shower   TED hose    Complete by:  As directed   Use stockings (TED hose) for 2 weeks on both leg(s).  You may remove them at night for sleeping.      Follow-up Information    Nilda Simmer, MD Follow up on 07/24/2016.   Specialty:  Orthopedic Surgery Why:  appt time 3 pm   first post op visit must be in [facility] information: [address] [place] [address] [phone]            Signed: Pascal Lux 07/11/2016, 8:51 AM

## 2016-07-11 NOTE — Progress Notes (Signed)
Reviewed discharge papers and medications with Ms Carol Dunn and x-husband with full understanding Called D/C services to take to car

## 2016-07-11 NOTE — Care Management Note (Signed)
Case Management Note  Patient Details  Name: Carol Dunn MRN: [id number] Date of Birth: 07/17/1966  Subjective/Objective: 50 yr old female s/p right total knee arthroplasty.                   Action/Plan: Case manager spoke with patient concerning Home health and DME needs. Choice for Home health agency was offered, referral was called to Beat[facility] Stallion[facility], [facility]. Patient has rolling walker, 3in1 and CPM at the home. She will have family support at discharge.   Expected Discharge Date:   07/11/16               Expected Discharge Plan:  Home w Home Health Services  In-House Referral:  NA  Discharge planning Services  CM Consult  Post Acute Care Choice:  Home Health Choice offered to:  Patient  DME Arranged:  N/A DME Agency:  [employer]  HH Arranged:  PT HH Agency:  [facility]  Status of Service:  Completed, signed off  If discussed at Long Length of Stay Meetings, dates discussed:    Additional Comments:  Carol Dunn, Carol Speth [name], RN 07/11/2016, 12:43 PM

## 2016-07-11 NOTE — Progress Notes (Signed)
Physical Therapy Treatment Patient Details Name: Carol Dunn MRN: [id number] DOB: 04-12-1966 Today's Date: 07/11/2016    History of Present Illness Patient is a 50 y/o female with hx of HTN and headache presents s/p rt TKA.     PT Comments    Patient progressing well towards PT goals. Tolerated gait training and stair training with supervision for safety. Instructed pt in exercises and provided handout. Pt plans to discharge home with ex husband today. Will continue to follow if still in hospital.    Follow Up Recommendations  Home health PT;Supervision for mobility/OOB     Equipment Recommendations  None recommended by PT    Recommendations for Other Services       Precautions / Restrictions Precautions Precautions: Knee Precaution Booklet Issued: No Precaution Comments: Reviewed no pillow under knee and precautions Knee Immobilizer - Right: Other (comment) (orders for R Knee immobilizer discontinued) Restrictions Weight Bearing Restrictions: Yes RLE Weight Bearing: Weight bearing as tolerated    Mobility  Bed Mobility               General bed mobility comments: Sitting in chair upon PT arrival.   Transfers Overall transfer level: Needs assistance Equipment used: Rolling walker (2 wheeled) Transfers: Sit to/from Stand Sit to Stand: Supervision         General transfer comment: Supervision for safety.   Ambulation/Gait Ambulation/Gait assistance: Supervision Ambulation Distance (Feet): 250 Feet Assistive device: Rolling walker (2 wheeled) Gait Pattern/deviations: Step-through pattern;Decreased stride length;Decreased stance time - right Gait velocity: decreased Gait velocity interpretation: Below normal speed for age/gender General Gait Details: Slow, steady gait with cues for knee extension during stance phase and knee flexion during swing phase. Cues to relax shoulders.   Stairs Stairs: Yes Stairs assistance: Supervision Stair Management:  Step to pattern Number of Stairs: 5 General stair comments: Cues for technique and safety.  Wheelchair Mobility    Modified Rankin (Stroke Patients Only)       Balance Overall balance assessment: Needs assistance Sitting-balance support: Feet supported;No upper extremity supported Sitting balance-Leahy Scale: Good     Standing balance support: During functional activity Standing balance-Leahy Scale: Fair Standing balance comment: Relies on BUE during mobility for ADL, but able to stand at sink and perform ADL with no UE support                    Cognition Arousal/Alertness: Awake/alert Behavior During Therapy: WFL for tasks assessed/performed Overall Cognitive Status: Within Functional Limits for tasks assessed                      Exercises Total Joint Exercises Quad Sets: Both;10 reps;Seated Towel Squeeze: Both;10 reps;Seated Heel Slides: Right;10 reps;Seated Hip ABduction/ADduction: Right;10 reps;Seated Goniometric ROM: 4-85 degrees knee AROM    General Comments General comments (skin integrity, edema, etc.): Ex husband present during session.      Pertinent Vitals/Pain Pain Assessment: Faces Faces Pain Scale: Hurts little more Pain Location: right knee Pain Descriptors / Indicators: Sore;Operative site guarding;Guarding Pain Intervention(s): Monitored during session;Repositioned    Home Living Family/patient expects to be discharged to:: Private residence Living Arrangements: Spouse/significant other;Children Available Help at Discharge: Family;Available 24 hours/day Type of Home: House Home Access: Stairs to enter Entrance Stairs-Rails: Right Home Layout: One level Home Equipment: Walker - 2 wheels;Bedside commode      Prior Function Level of Independence: Independent      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  PT Goals (current goals can now be found in the care plan section) Acute Rehab PT Goals Patient  Stated Goal: to get back to work Progress towards PT goals: Progressing toward goals    Frequency  7X/week    PT Plan Current plan remains appropriate    Co-evaluation             End of Session Equipment Utilized During Treatment: Gait belt Activity Tolerance: Patient tolerated treatment well Patient left: in chair;with call bell/phone within reach;with family/visitor present     Time: 0940-1003 PT Time Calculation (min) (ACUTE ONLY): 23 min  Charges:  $Gait Training: 8-22 mins $Therapeutic Exercise: 8-22 mins                    G Codes:      Saiya Crist A Daaiel Starlin 07/11/2016, 10:38 AM  Mylo RedShauna Maziah Smola, PT, DPT [phone]

## 2016-07-11 NOTE — Evaluation (Signed)
Occupational Therapy Evaluation and Discharge Patient Details Name: Carol Dunn MRN: [id number] DOB: 11/27/1965 Today's Date: 07/11/2016    History of Present Illness Patient is a 50 y/o female with hx of HTN and headache presents s/p rt TKA.    Clinical Impression   Pt is pleasant and willing to work with therapy. Pt able to perform ADL standing at sink, and tub transfer. Pt and husband educated on sequencing, safety, and importance of caregiver support for tub transfer. Pt able to reach feet for LB dressing/bathing and able to perform toileting without assistance. Pt and husband did not have any questions or concerns going home in performing self-care tasks.     Follow Up Recommendations  No OT follow up;Supervision - Intermittent    Equipment Recommendations  None recommended by OT    Recommendations for Other Services       Precautions / Restrictions Precautions Precautions: Knee Knee Immobilizer - Right: Other (comment) (orders for R Knee immobilizer discontinued) Restrictions Weight Bearing Restrictions: Yes RLE Weight Bearing: Weight bearing as tolerated      Mobility Bed Mobility               General bed mobility comments: Pt sitting in recliner upon entry of OT  Transfers Overall transfer level: Needs assistance Equipment used: Rolling walker (2 wheeled)             General transfer comment: min guard for safety and increased time    Balance Overall balance assessment: Needs assistance Sitting-balance support: No upper extremity supported;Feet supported Sitting balance-Leahy Scale: Good     Standing balance support: No upper extremity supported;During functional activity Standing balance-Leahy Scale: Fair Standing balance comment: Relies on BUE during mobility for ADL, but able to stand at sink and perform ADL with no UE support                            ADL Overall ADL's : Needs assistance/impaired Eating/Feeding: Modified  independent;Sitting Eating/Feeding Details (indicate cue type and reason): Eating breakfast sitting in recliner. No problems Grooming: Wash/dry hands;Wash/dry face;Oral care;Supervision/safety;Standing Grooming Details (indicate cue type and reason): at sink Upper Body Bathing: Set up   Lower Body Bathing: Minimal assistance;With caregiver independent assisting Lower Body Bathing Details (indicate cue type and reason): Able to bring foot up to mid shin and reach. husband also available for help at first. Upper Body Dressing : Modified independent;Sitting   Lower Body Dressing: Minimal assistance Lower Body Dressing Details (indicate cue type and reason): min assist for RLE Toilet Transfer: Supervision/safety;Comfort height toilet;BSC Toilet Transfer Details (indicate cue type and reason): benefits from 3 in 1 over toilet for power up Toileting- Clothing Manipulation and Hygiene: Supervision/safety;Sit to/from stand Toileting - Clothing Manipulation Details (indicate cue type and reason): managed hospital gown and peri care Tub/ Shower Transfer: Tub transfer;Minimal assistance;With caregiver independent assisting;Cueing for sequencing;3 in 1;Rolling walker (min assist to lift RLE over. ) Tub/Shower Transfer Details (indicate cue type and reason): practiced tub transfer with 3 in 1 and rolling walker. OT stressed husband be present for first couple of times until they are both comfortable for safety Functional mobility during ADLs: Supervision/safety General ADL Comments: No concerns from Pt or husband about ADL going home     Vision     Perception     Praxis      Pertinent Vitals/Pain Pain Assessment: Faces Faces Pain Scale: Hurts little more Pain Location: R knee Pain Descriptors /  Indicators: Discomfort;Grimacing;Operative site guarding;Sore Pain Intervention(s): Monitored during session;Premedicated before session;Repositioned     Hand Dominance     Extremity/Trunk  Assessment Upper Extremity Assessment Upper Extremity Assessment: Overall WFL for tasks assessed   Lower Extremity Assessment Lower Extremity Assessment: RLE deficits/detail RLE Deficits / Details: Limited AROM/strength secondary to pain/surgery       Communication Communication Communication: No difficulties   Cognition Arousal/Alertness: Awake/alert Behavior During Therapy: WFL for tasks assessed/performed Overall Cognitive Status: Within Functional Limits for tasks assessed                     General Comments       Exercises       Shoulder Instructions      Home Living Family/patient expects to be discharged to:: Private residence Living Arrangements: Spouse/significant other;Children Available Help at Discharge: Family;Available 24 hours/day Type of Home: House Home Access: Stairs to enter Enter[employer] CorporationEntrance Stairs-Number of Steps: 5 Entrance Stairs-Rails: Right Home Layout: One level     Bathroom Shower/Tub: IT trainerTub/shower unit;Curtain   Bathroom Toilet: Standard Bathroom Accessibility: Yes How Accessible: Accessible via walker Home Equipment: Walker - 2 wheels;Bedside commode          Prior Functioning/Environment Level of Independence: Independent        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     OT Diagnosis: Acute pain   OT Problem List: Decreased range of motion;Pain   OT Treatment/Interventions:      OT Goals(Current goals can be found in the care plan section) Acute Rehab OT Goals Patient Stated Goal: to get back to work OT Goal Formulation: With patient  OT Frequency:     Barriers to D/C:            Co-evaluation              End of Session Equipment Utilized During Treatment: Gait belt;Rolling walker CPM Right Knee CPM Right Knee: Off Additional Comments: Trapeze bar Nurse Communication: Mobility status  Activity Tolerance: Patient tolerated treatment well Patient left: in chair;with call bell/phone  within reach;with family/visitor present   Time: 0902-0926 OT Time Calculation (min): 24 min Charges:  OT General Charges $OT Visit: 1 Procedure OT Evaluation $OT Eval Low Complexity: 1 Procedure OT Treatments $Self Care/Home Management : 8-22 mins G-Codes:    Evern BioLaura J Darene Nappi 07/11/2016, 10:31 AM Sherryl MangesLaura Mong Neal OTR/L [phone]

## 2017-04-05 ENCOUNTER — Other Ambulatory Visit: Payer: Self-pay | Admitting: Physician Assistant

## 2017-04-05 ENCOUNTER — Ambulatory Visit (HOSPITAL_COMMUNITY)
Admission: RE | Admit: 2017-04-05 | Discharge: 2017-04-05 | Disposition: A | Discharge status: Home / Self Care | Payer: BC Managed Care – PPO | Source: Ambulatory Visit | Attending: Physician Assistant | Admitting: Physician Assistant

## 2017-04-05 DIAGNOSIS — M79661 Pain in right lower leg: Secondary | ICD-10-CM

## 2017-04-05 NOTE — Progress Notes (Signed)
**  Preliminary report by tech**  Right lower extremity venous duplex complete. There is no evidence of deep or superficial vein thrombosis involving the right lower extremity. All visualized vessels appear patent and compressible.  Incidental findings are consistent with: Baker's Cyst measuring 1.5 cm high by 2.9 cm wide by greater than 4.6 cm long on the right. Results were given to First Data CorporationKirstin Shepperson PA.  04/05/17 4:51 PM Olen CordialGreg Parnell Spieler RVT

## 2017-06-28 ENCOUNTER — Other Ambulatory Visit: Payer: Self-pay | Admitting: Family

## 2017-06-28 DIAGNOSIS — Z1231 Encounter for screening mammogram for malignant neoplasm of breast: Secondary | ICD-10-CM

## 2017-07-17 ENCOUNTER — Ambulatory Visit
Admission: RE | Admit: 2017-07-17 | Discharge: 2017-07-17 | Disposition: A | Discharge status: Home / Self Care | Payer: BC Managed Care – PPO | Source: Ambulatory Visit | Attending: Family | Admitting: Family

## 2017-07-17 DIAGNOSIS — Z1231 Encounter for screening mammogram for malignant neoplasm of breast: Secondary | ICD-10-CM | POA: Insufficient documentation

## 2018-06-21 IMAGING — MR MR KNEE*R* W/O CM
6 series · 37 of 40 positions shown · non-contrast
Comparison: Plain films right knee 04/05/2016. MRI right knee
10/09/2012.

CLINICAL DATA: Right knee pain for 4 years, worsened over the past
4 months. Swelling. No known injury.

EXAM:
MRI OF THE RIGHT KNEE WITHOUT CONTRAST
TECHNIQUE: Multiplanar, multisequence MR imaging of the knee was performed. No
intravenous contrast was administered.

[Series 3: PD fat-sat · axial · 3.0mm · 0.29mm/px · z∈[-72,+40]mm · 7 of 35 slices shown (1 of 4)]
[im 1/35]
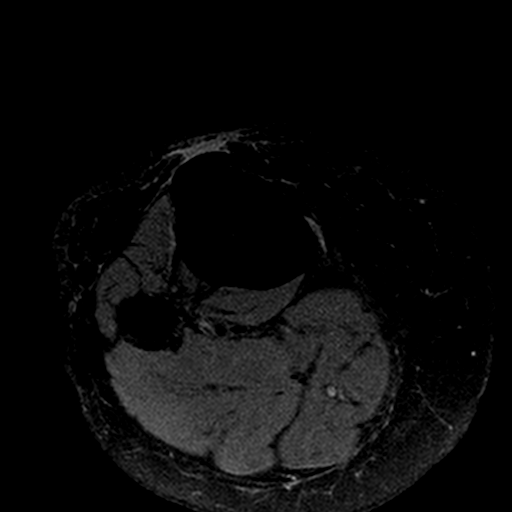
[im 6/35]
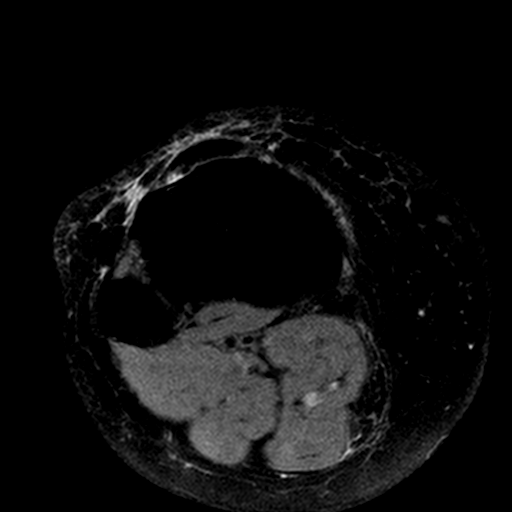
[im 12/35]
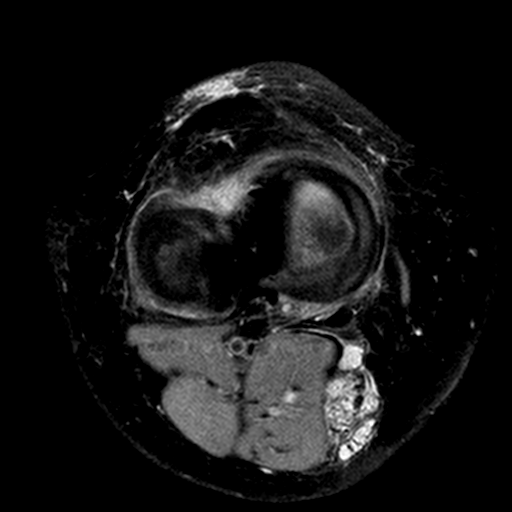
[im 18/35]
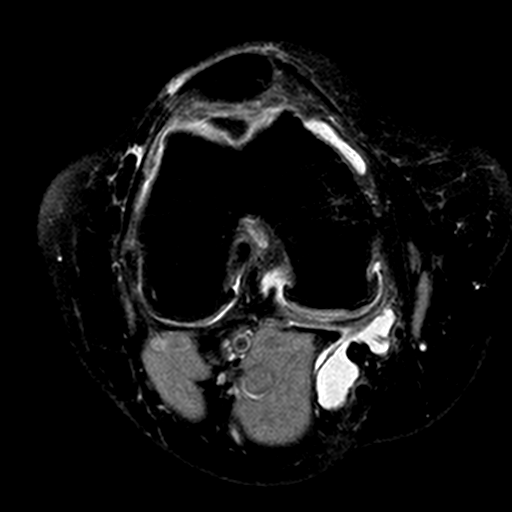
[im 23/35]
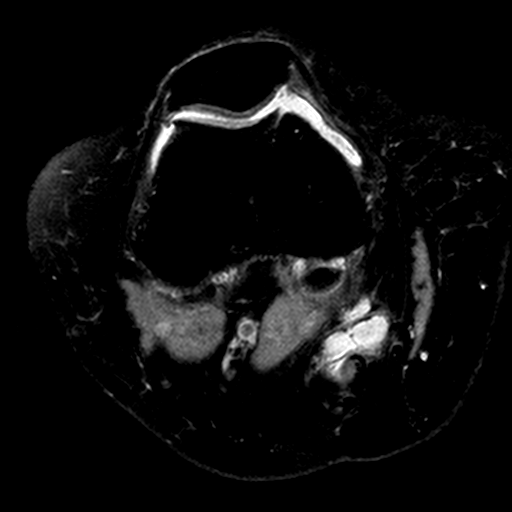
[im 29/35]
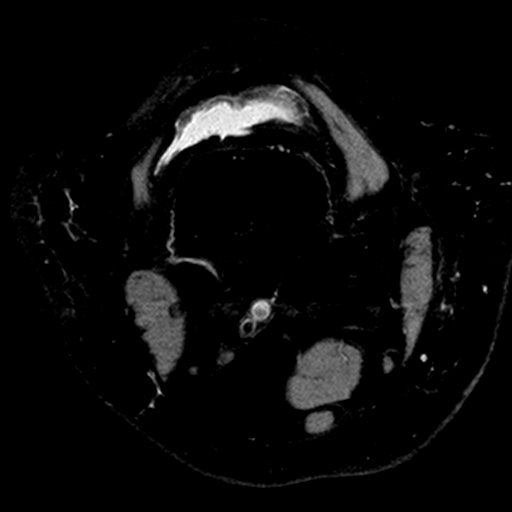
[im 35/35]
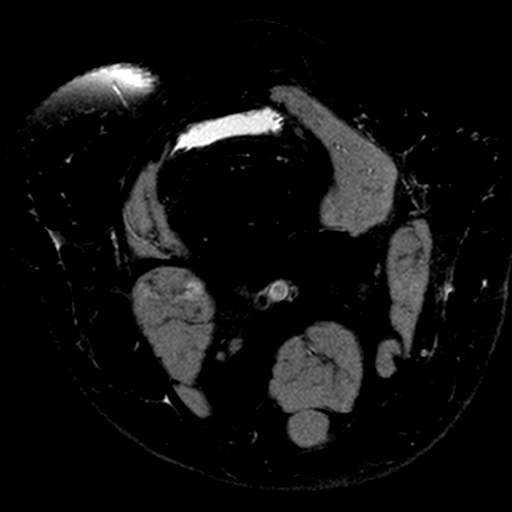

[Series 4: T1 · coronal · 3.0mm · 0.62mm/px · 4 of 31 slices shown]
[im 1/31]
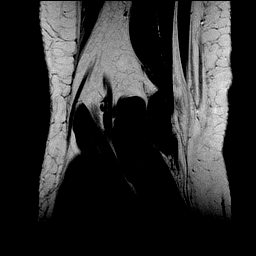
[im 6/31]
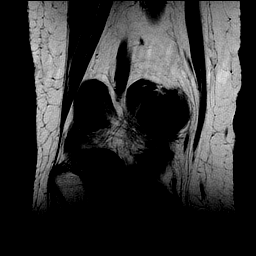
[im 11/31]
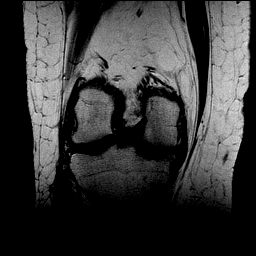
[im 16/31]
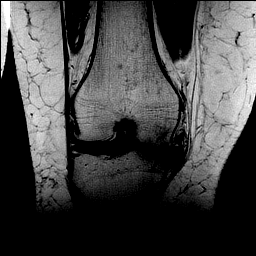

[Series 5: T2 fat-sat · coronal · 3.0mm · 0.62mm/px · 7 of 31 slices shown]
[im 1/31]
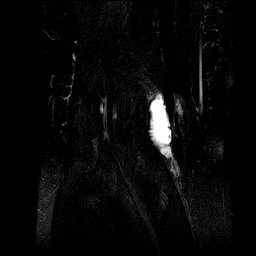
[im 6/31]
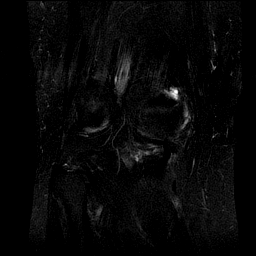
[im 11/31]
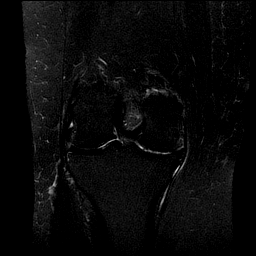
[im 16/31]
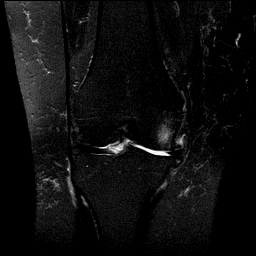
[im 21/31]
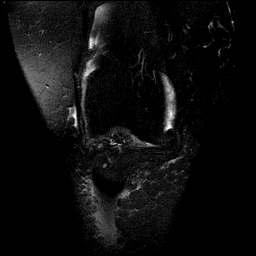
[im 26/31]
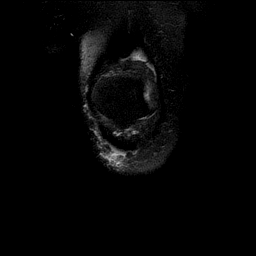
[im 31/31]
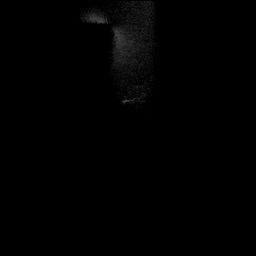

[Series 6: PD fat-sat · sagittal · 3.0mm · 0.62mm/px · 8 of 35 slices shown (2 of 4)]
[im 1/35]
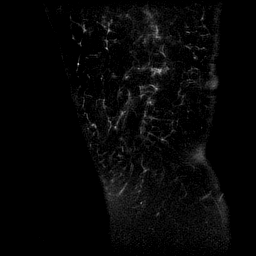
[im 5/35]
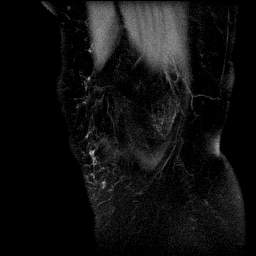
[im 10/35]
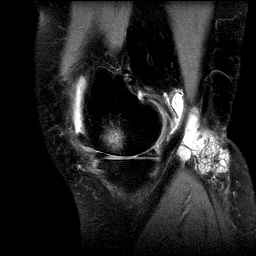
[im 15/35]
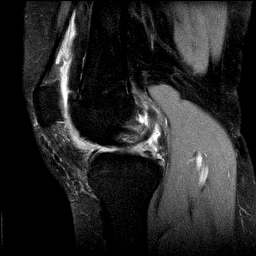
[im 20/35]
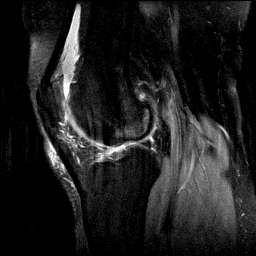
[im 25/35]
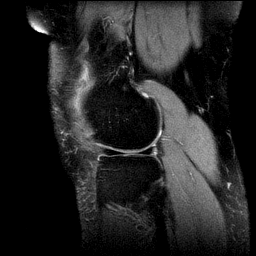
[im 30/35]
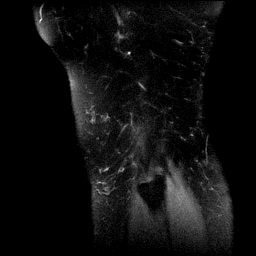
[im 35/35]
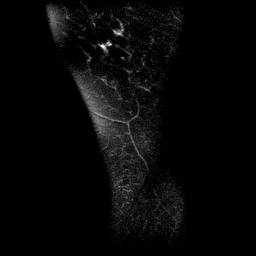

[Series 7: PD fat-sat · coronal · 3.0mm · 0.62mm/px · 7 of 31 slices shown (3 of 4)]
[im 1/31]
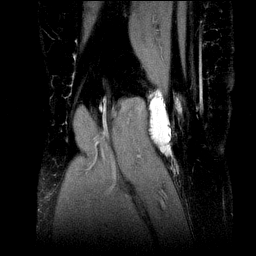
[im 6/31]
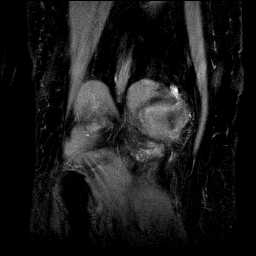
[im 11/31]
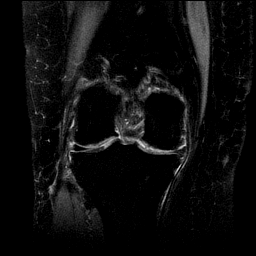
[im 16/31]
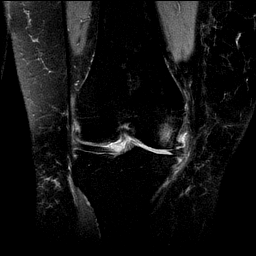
[im 21/31]
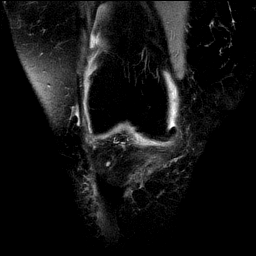
[im 26/31]
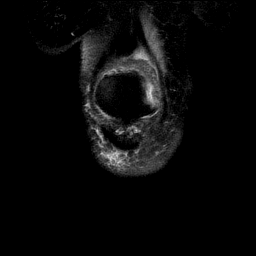
[im 31/31]
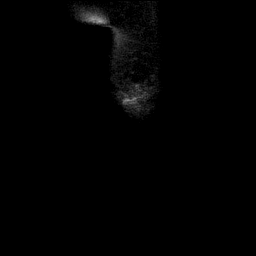

[Series 8: PD fat-sat · coronal · 2.0mm · 0.31mm/px · 4 of 19 slices shown (4 of 4)]
[im 1/19]
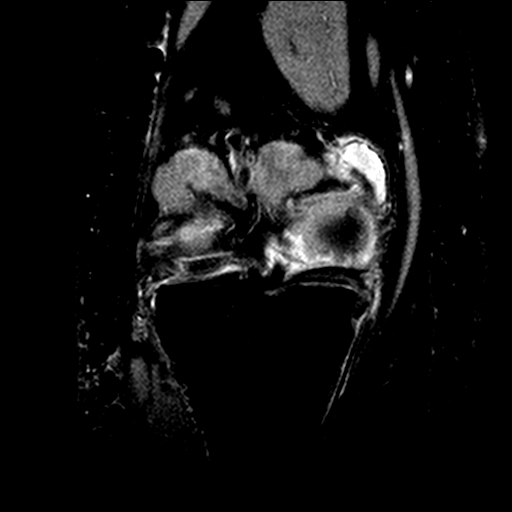
[im 7/19]
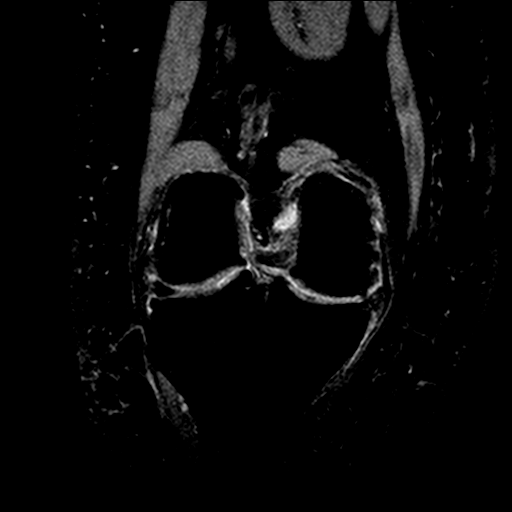
[im 13/19]
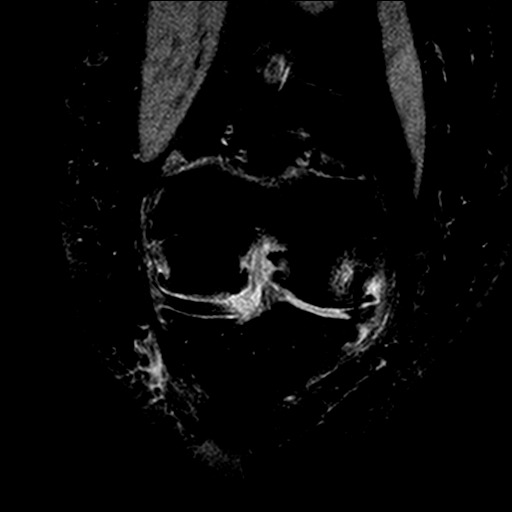
[im 19/19]
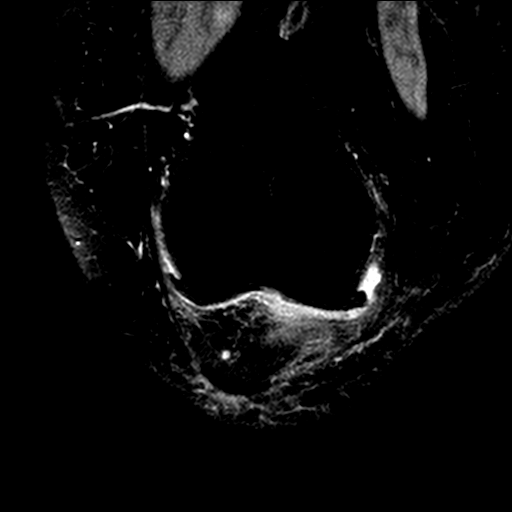

[37 of 40 positions shown; findings below may reference images not displayed]

FINDINGS: MENISCI

Medial meniscus: There is marked fraying along the free edge of the
body. Degenerative signal is seen in the posterior horn and body. No
focal tear.

Lateral meniscus:  Intact.

LIGAMENTS

Cruciates:  Intact.

Collaterals:  Intact.

CARTILAGE

Patellofemoral: There has been progressive osteoarthritic change
with worsened irregularity and thinning of hyaline cartilage about
both the patella and femoral trochlea.

Medial: Progressive cartilage loss is present diffusely with
associated joint space narrowing.

Lateral: Moderately degenerated, worsened since on the prior study.

Joint:  Small joint effusion.

Popliteal Fossa: Multi-septated Baker's cyst containing debris
measures 1.7 cm transverse by 2.3 cm AP by 5.6 cm craniocaudal. The
cyst was present on the prior examination and appears slightly
smaller today.

Extensor Mechanism:  Intact.

Bones: New subchondral edema is present in the weight-bearing
surface of the medial femoral condyle. There has been progression of
osteophytosis about the knee, worst medially.

Other: None
IMPRESSION: Progressive osteoarthritis about the knee appears worst in the
medial compartment where it is advanced.

Negative for meniscal or ligament tear. Fraying along the free edge
of the body of the medial meniscus is seen.

Slight decrease in the size of a large multi-septated Baker cyst
containing debris.

## 2018-07-11 ENCOUNTER — Other Ambulatory Visit: Payer: Self-pay | Admitting: Family Medicine

## 2018-07-11 DIAGNOSIS — Z1231 Encounter for screening mammogram for malignant neoplasm of breast: Secondary | ICD-10-CM

## 2018-07-19 ENCOUNTER — Other Ambulatory Visit: Payer: Self-pay

## 2018-07-19 ENCOUNTER — Emergency Department (HOSPITAL_COMMUNITY)
Admission: EM | Admit: 2018-07-19 | Discharge: 2018-07-19 | Disposition: A | Discharge status: Home / Self Care | Payer: BC Managed Care – PPO | Attending: Emergency Medicine | Admitting: Emergency Medicine

## 2018-07-19 ENCOUNTER — Encounter (HOSPITAL_COMMUNITY): Payer: Self-pay | Admitting: Emergency Medicine

## 2018-07-19 ENCOUNTER — Emergency Department (HOSPITAL_COMMUNITY): Payer: BC Managed Care – PPO

## 2018-07-19 DIAGNOSIS — I10 Essential (primary) hypertension: Secondary | ICD-10-CM | POA: Diagnosis not present

## 2018-07-19 DIAGNOSIS — Y998 Other external cause status: Secondary | ICD-10-CM | POA: Diagnosis not present

## 2018-07-19 DIAGNOSIS — Z79899 Other long term (current) drug therapy: Secondary | ICD-10-CM | POA: Insufficient documentation

## 2018-07-19 DIAGNOSIS — Z87891 Personal history of nicotine dependence: Secondary | ICD-10-CM | POA: Insufficient documentation

## 2018-07-19 DIAGNOSIS — Y9389 Activity, other specified: Secondary | ICD-10-CM | POA: Diagnosis not present

## 2018-07-19 DIAGNOSIS — S40012A Contusion of left shoulder, initial encounter: Secondary | ICD-10-CM | POA: Insufficient documentation

## 2018-07-19 DIAGNOSIS — Z96651 Presence of right artificial knee joint: Secondary | ICD-10-CM | POA: Diagnosis not present

## 2018-07-19 DIAGNOSIS — Y92410 Unspecified street and highway as the place of occurrence of the external cause: Secondary | ICD-10-CM | POA: Diagnosis not present

## 2018-07-19 DIAGNOSIS — T07XXXA Unspecified multiple injuries, initial encounter: Secondary | ICD-10-CM

## 2018-07-19 DIAGNOSIS — Z7901 Long term (current) use of anticoagulants: Secondary | ICD-10-CM | POA: Insufficient documentation

## 2018-07-19 DIAGNOSIS — S20212A Contusion of left front wall of thorax, initial encounter: Secondary | ICD-10-CM | POA: Insufficient documentation

## 2018-07-19 DIAGNOSIS — S8002XA Contusion of left knee, initial encounter: Secondary | ICD-10-CM | POA: Diagnosis not present

## 2018-07-19 DIAGNOSIS — S79912A Unspecified injury of left hip, initial encounter: Secondary | ICD-10-CM | POA: Diagnosis present

## 2018-07-19 DIAGNOSIS — Z9049 Acquired absence of other specified parts of digestive tract: Secondary | ICD-10-CM | POA: Diagnosis not present

## 2018-07-19 MED ORDER — IBUPROFEN 800 MG PO TABS
800.0000 mg | ORAL_TABLET | Freq: Once | ORAL | Status: AC
  Administered 2018-07-19: 800 mg via ORAL
  Filled 2018-07-19: qty 1

## 2018-07-19 MED ORDER — MELOXICAM 7.5 MG PO TABS: 15.0000 mg | ORAL_TABLET | Freq: Every day | ORAL | 0 refills | Status: DC
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
## 2018-07-19 NOTE — Discharge Instructions (Signed)
Your xrays are normal Mobic as needed for pain twice daily Ice and rest for 24 hours, then gradual return to activity

## 2018-07-29 ENCOUNTER — Ambulatory Visit
Admission: RE | Admit: 2018-07-29 | Discharge: 2018-07-29 | Disposition: A | Discharge status: Home / Self Care | Payer: BC Managed Care – PPO | Source: Ambulatory Visit | Attending: Family Medicine | Admitting: Family Medicine

## 2018-07-29 DIAGNOSIS — Z1231 Encounter for screening mammogram for malignant neoplasm of breast: Secondary | ICD-10-CM | POA: Insufficient documentation

## 2018-08-07 ENCOUNTER — Ambulatory Visit
Admission: RE | Admit: 2018-08-07 | Discharge: 2018-08-07 | Disposition: A | Discharge status: Home / Self Care | Payer: BC Managed Care – PPO | Source: Ambulatory Visit | Attending: Orthopedic Surgery | Admitting: Orthopedic Surgery

## 2018-08-07 ENCOUNTER — Other Ambulatory Visit: Payer: Self-pay | Admitting: Orthopedic Surgery

## 2018-08-07 DIAGNOSIS — R52 Pain, unspecified: Secondary | ICD-10-CM

## 2018-08-07 DIAGNOSIS — M25561 Pain in right knee: Secondary | ICD-10-CM | POA: Diagnosis present

## 2018-11-27 ENCOUNTER — Other Ambulatory Visit: Payer: Self-pay | Admitting: Sports Medicine

## 2018-11-27 DIAGNOSIS — M545 Low back pain, unspecified: Secondary | ICD-10-CM

## 2018-11-27 DIAGNOSIS — G8929 Other chronic pain: Secondary | ICD-10-CM

## 2018-12-02 ENCOUNTER — Other Ambulatory Visit: Payer: Self-pay | Admitting: Sports Medicine

## 2018-12-02 ENCOUNTER — Ambulatory Visit
Admission: RE | Admit: 2018-12-02 | Discharge: 2018-12-02 | Disposition: A | Discharge status: Home / Self Care | Payer: BC Managed Care – PPO | Source: Ambulatory Visit | Attending: Sports Medicine | Admitting: Sports Medicine

## 2018-12-02 DIAGNOSIS — M545 Low back pain, unspecified: Secondary | ICD-10-CM

## 2018-12-02 DIAGNOSIS — G8929 Other chronic pain: Secondary | ICD-10-CM

## 2018-12-02 MED ORDER — IOPAMIDOL (ISOVUE-M 200) INJECTION 41%: 1.0000 mL | Freq: Once | INTRAMUSCULAR | Status: DC

## 2018-12-02 MED ORDER — METHYLPREDNISOLONE ACETATE 40 MG/ML INJ SUSP (RADIOLOG: 120.0000 mg | Freq: Once | INTRAMUSCULAR | Status: DC

## 2018-12-02 NOTE — Discharge Instructions (Signed)

## 2019-06-27 ENCOUNTER — Other Ambulatory Visit: Payer: Self-pay | Admitting: Family

## 2019-06-27 DIAGNOSIS — Z1231 Encounter for screening mammogram for malignant neoplasm of breast: Secondary | ICD-10-CM

## 2019-08-06 ENCOUNTER — Ambulatory Visit
Admission: RE | Admit: 2019-08-06 | Discharge: 2019-08-06 | Disposition: A | Discharge status: Home / Self Care | Payer: BC Managed Care – PPO | Source: Ambulatory Visit | Attending: Family | Admitting: Family

## 2019-08-06 DIAGNOSIS — Z1231 Encounter for screening mammogram for malignant neoplasm of breast: Secondary | ICD-10-CM | POA: Diagnosis not present

## 2020-07-05 ENCOUNTER — Other Ambulatory Visit: Payer: Self-pay | Admitting: Family

## 2020-07-05 DIAGNOSIS — Z1231 Encounter for screening mammogram for malignant neoplasm of breast: Secondary | ICD-10-CM

## 2020-08-18 ENCOUNTER — Ambulatory Visit
Admission: RE | Admit: 2020-08-18 | Discharge: 2020-08-18 | Disposition: A | Discharge status: Home / Self Care | Payer: BC Managed Care – PPO | Source: Ambulatory Visit | Attending: Family | Admitting: Family

## 2020-08-18 ENCOUNTER — Other Ambulatory Visit: Payer: Self-pay

## 2020-08-18 DIAGNOSIS — Z1231 Encounter for screening mammogram for malignant neoplasm of breast: Secondary | ICD-10-CM | POA: Insufficient documentation

## 2021-03-25 DIAGNOSIS — R7303 Prediabetes: Secondary | ICD-10-CM | POA: Insufficient documentation

## 2021-07-20 ENCOUNTER — Other Ambulatory Visit: Payer: Self-pay | Admitting: Family

## 2021-07-20 DIAGNOSIS — Z1231 Encounter for screening mammogram for malignant neoplasm of breast: Secondary | ICD-10-CM

## 2021-08-22 ENCOUNTER — Ambulatory Visit
Admission: RE | Admit: 2021-08-22 | Discharge: 2021-08-22 | Disposition: A | Discharge status: Home / Self Care | Payer: BC Managed Care – PPO | Source: Ambulatory Visit | Attending: Family | Admitting: Family

## 2021-08-22 ENCOUNTER — Other Ambulatory Visit: Payer: Self-pay

## 2021-08-22 DIAGNOSIS — Z1231 Encounter for screening mammogram for malignant neoplasm of breast: Secondary | ICD-10-CM | POA: Insufficient documentation

## 2022-03-06 ENCOUNTER — Encounter: Payer: Self-pay | Admitting: Orthopedic Surgery

## 2022-03-06 ENCOUNTER — Ambulatory Visit: Payer: BC Managed Care – PPO | Admitting: Orthopedic Surgery

## 2022-03-06 VITALS — BP 152/88 | HR 114 | Ht 62.0 in | Wt 183.0 lb

## 2022-03-06 DIAGNOSIS — M1712 Unilateral primary osteoarthritis, left knee: Secondary | ICD-10-CM

## 2022-03-06 DIAGNOSIS — M1710 Unilateral primary osteoarthritis, unspecified knee: Secondary | ICD-10-CM

## 2022-03-06 NOTE — Patient Instructions (Signed)

## 2022-03-06 NOTE — Progress Notes (Signed)
NEW PROBLEM//OFFICE VISIT ? ? ?Chief Complaint  ?Patient presents with  ? Knee Pain  ?  Left- no injury been hurting bad for about the last 3 months  ? ?This is a 56 year old female status post right total knee in [place] by Dr. Noemi Chapel about 6 years ago.  Comes in with left knee pain saying that it feels similar to the right ? ?She says it is hard for her to walk and do her activities ? ?She is having 9 out of 10 pain medial side of the knee ? ? no positive findings on review of systems ? ? ? ? ?BP (!) 152/88   Pulse (!) 114   Ht 5\' 2"  (1.575 m)   Wt 183 lb (83 kg)   BMI 33.47 kg/m?  ? ?Body mass index is 33.47 kg/m?. ? ?General appearance: Well-developed well-nourished no gross deformities ? ?Cardiovascular normal pulse and perfusion normal color without edema ? ?Neurologically no sensation loss or deficits or pathologic reflexes ? ?Psychological: Awake alert and oriented x3 mood and affect normal ? ?Skin no lacerations or ulcerations no nodularity no palpable masses, no erythema or nodularity ? ?Musculoskeletal: Right knee well-healed incision normal alignment no swelling ? ?Left knee full extension 100 degrees of flexion stable in flexion and extension tenderness medial joint medium size joint effusion ? ? ? ? ? ?Past Medical History:  ?Diagnosis Date  ? Headache   ? Hypertension   ? takes Lisinopril daily  ? Joint pain   ? Primary localized osteoarthritis of right knee   ? ? ?Past Surgical History:  ?Procedure Laterality Date  ? ABDOMINAL HYSTERECTOMY    ? CHOLECYSTECTOMY    ? COLONOSCOPY    ? JOINT REPLACEMENT  07/10/2016  ? right knee  ? NECK SURGERY    ? states piece of glass was in neck and had to get that out  ? OOPHORECTOMY    ? TOTAL KNEE ARTHROPLASTY Right 07/10/2016  ? Procedure: TOTAL KNEE ARTHROPLASTY;  Surgeon: Elsie Saas, MD;  Location: [facility];  Service: Orthopedics;  Laterality: Right;  ? ? ?Family History  ?Problem Relation Age of Onset  ? Breast cancer Sister 30  ? Breast cancer Sister 61   ? ?Social History  ? ?Tobacco Use  ? Smoking status: Former  ? Smokeless tobacco: Never  ? Tobacco comments:  ?  quit smoking 20+ yrs ago  ?Substance Use Topics  ? Alcohol use: No  ? Drug use: No  ? ? ?Allergies  ?Allergen Reactions  ? No Known Allergies   ? ? ?Current Meds  ?Medication Sig  ? atorvastatin (LIPITOR) 20 MG tablet Take 20 mg by mouth daily.  ? capsaicin (ZOSTRIX) Q000111Q % cream 1 application to affected area  ? Cholecalciferol (VITAMIN D) 125 MCG (5000 UT) CAPS 1 tablet  ? lisinopril-hydrochlorothiazide (ZESTORETIC) 20-12.5 MG tablet Take 1 tablet by mouth daily.  ? Multiple Vitamins-Calcium (ONE-A-DAY WOMENS PO) Take 1 tablet by mouth daily.  ? [DISCONTINUED] cefUROXime (CEFTIN) 500 MG tablet Take 1 tablet (500 mg total) by mouth 2 (two) times daily with a meal.  ? [DISCONTINUED] Cholecalciferol 25 MCG (1000 UT) tablet Take 1 tablet by mouth daily.  ? [DISCONTINUED] docusate sodium (COLACE) 100 MG capsule 1 tab 2 times a day while on narcotics.  STOOL SOFTENER  ? [DISCONTINUED] lisinopril (PRINIVIL,ZESTRIL) 10 MG tablet Take 10 mg by mouth daily.   ? [DISCONTINUED] oxyCODONE (OXY IR/ROXICODONE) 5 MG immediate release tablet 1-2 tablets every 4-6 hrs as needed for  pain  ? [DISCONTINUED] polyethylene glycol (MIRALAX / GLYCOLAX) packet 17grams in 6 oz of water twice a day until bowel movement.  LAXITIVE.  Restart if two days since last bowel movement  ? ? ? ?MEDICAL DECISION MAKING ? ?A. No diagnosis found. ? ?B. DATA ANALYSED: ? ? ?IMAGING: ?Interpretation of images: I have personally reviewed the images and my interpretation is external images AP and lateral left knee ? ?Grade 4 arthritis medial compartment ?Orders: No new orders ? ?Outside records reviewed: No external records ? ? ?C. MANAGEMENT  ? ?Aspiration injection left knee ? ?Procedure note injection and aspiration left knee joint ? ?Verbal consent was obtained to aspirate and inject the left knee joint  ? ?Timeout was completed to confirm the  site of aspiration and injection ? ?An 18-gauge needle was used to aspirate the left knee joint from a suprapatellar lateral approach. ? ?The medications used were 40 mg of Depo-Medrol and 1% lidocaine 3 cc ? ?Anesthesia was provided by ethyl chloride and the skin was prepped with alcohol. ? ?After cleaning the skin with alcohol an 18-gauge needle was used to aspirate the right knee joint. ? ?We obtained 15 cc of fluid clear yellow with particles of cartilage ? ?We followed this by injection of 40 mg of Depo-Medrol and 3 cc 1% lidocaine. ? ?There were no complications. A sterile bandage was applied.  ? ?No orders of the defined types were placed in this encounter. ? ? ? ?Patient wants to come back in June to set up surgery for July ? ?Arther Abbott, MD ? ?03/06/2022 ?11:01 AM ?

## 2022-04-17 ENCOUNTER — Encounter: Payer: Self-pay | Admitting: Orthopedic Surgery

## 2022-04-17 ENCOUNTER — Ambulatory Visit: Payer: BC Managed Care – PPO | Admitting: Orthopedic Surgery

## 2022-04-17 DIAGNOSIS — I10 Essential (primary) hypertension: Secondary | ICD-10-CM

## 2022-04-17 DIAGNOSIS — M1710 Unilateral primary osteoarthritis, unspecified knee: Secondary | ICD-10-CM

## 2022-04-17 DIAGNOSIS — Z01818 Encounter for other preprocedural examination: Secondary | ICD-10-CM | POA: Diagnosis not present

## 2022-04-17 DIAGNOSIS — M1712 Unilateral primary osteoarthritis, left knee: Secondary | ICD-10-CM | POA: Diagnosis not present

## 2022-04-17 MED ORDER — BUPIVACAINE-MELOXICAM ER 200-6 MG/7ML IJ SOLN: 400.0000 mg | Freq: Once | INTRAMUSCULAR | Status: DC

## 2022-04-17 NOTE — Patient Instructions (Addendum)
Your surgery will be at [facility] by Dr Oscar Forman expect to be in the hospital overnight. The hospital will contact you with a preoperative appointment to discuss Anesthesia. Please bring your medications with you for the appointment. They will tell you the arrival time and medication instructions when you have your preoperative evaluation. Do not wear nail polish the day of your surgery and if you take Phentermine you need to stop this medication ONE WEEK prior to your surgery.   You will also get a call from a representative of [employer], they have a machine that you will use in the first few weeks after surgery. It is called a CPM.   You will have home physical therapy for 2 weeks after surgery, the home health agency will call you before or just following the surgery to set up visits. [facility] is the agency we normally use, unless you request another agency.   You will get a call also from outpatient therapy for therapy starting when the home therapy is done.  If you have questions or need to Reschedule the surgery, call the office ask for Amy.   You have decided to proceed with knee replacement surgery. You have decided not to continue with nonoperative measures such as but not limited to oral medication, weight loss, activity modification, physical therapy, bracing, or injection.  We will perform the procedure commonly known as total knee replacement. Some of the risks associated with knee replacement surgery include but are not limited to Bleeding Infection Swelling Stiffness Blood clot Pulmonary embolism  Loosening of the implant Pain that persists even after surgery  Infection is especially devastating complication of knee surgery although rare. If infection does occur your implant will usually have to be removed and several surgeries and antibiotics will be needed to eradicate the infection prior to performing a repeat replacement.   In some cases amputation is required to  eradicate the infection. In other rare cases a knee fusion is needed   In compliance with recent  law in federal regulation regarding opioid use and abuse and addiction, we will taper (stop) opioid medication after 2 weeks.  If you're not comfortable with these risks and would like to continue with nonoperative treatment please let Dr. Shreeya Recendiz know prior to your surgery.    

## 2022-04-17 NOTE — Addendum Note (Signed)
Addended byCaffie Damme on: 04/17/2022 11:46 AM   Modules accepted: Orders

## 2022-04-17 NOTE — Progress Notes (Signed)
FOLLOW UP   Encounter Diagnosis  Name Primary?   Primary localized osteoarthritis of knee left Yes     Chief Complaint  Patient presents with   Follow-up    Recheck on left knee   This is a 56 year old female who is interested in a left total knee.  She had a right total knee in [place] by Dr. Noemi Chapel about 6 years ago.  She works at the prison as a guard.  She was able to return to work after 3 months after her last total knee  She presented to [place] in April with 9 out of 10 medial sided knee pain she had noted that it was hard for her to walk and do her activities  She wanted to have surgery in July  She still wants to have the surgery in late July.  She still has the severe left pain primarily on the medial joint  Fortunately her range of motion has improved to about 120 degrees in terms of her flexion.  She has 6 steps to get into the house her husband is going to help her after surgery  The risks of surgery are explained to her   PREOP ASSESSMENTS  Social setting  -stairs 6 -helper HUSBAND   Risks of surgery   Bleeding  Infection  Clot  Embolism  Pain   Alternative treatments  Injection activity modification  Pain management  (6 week rule)    Post op   PT    DME's   QUESTIONS?  Were answered   HANDOUTS were given  Plan left total knee for osteoarthritis LEFT KNEE TKA

## 2022-05-24 ENCOUNTER — Other Ambulatory Visit: Payer: Self-pay | Admitting: Orthopedic Surgery

## 2022-05-24 ENCOUNTER — Telehealth: Payer: Self-pay | Admitting: Radiology

## 2022-05-24 ENCOUNTER — Encounter: Payer: Self-pay | Admitting: Radiology

## 2022-05-24 DIAGNOSIS — E782 Mixed hyperlipidemia: Secondary | ICD-10-CM | POA: Insufficient documentation

## 2022-05-24 DIAGNOSIS — Z90710 Acquired absence of both cervix and uterus: Secondary | ICD-10-CM | POA: Insufficient documentation

## 2022-05-24 DIAGNOSIS — M1990 Unspecified osteoarthritis, unspecified site: Secondary | ICD-10-CM | POA: Insufficient documentation

## 2022-05-24 DIAGNOSIS — M1712 Unilateral primary osteoarthritis, left knee: Secondary | ICD-10-CM

## 2022-05-24 DIAGNOSIS — M5430 Sciatica, unspecified side: Secondary | ICD-10-CM | POA: Insufficient documentation

## 2022-05-24 DIAGNOSIS — M25562 Pain in left knee: Secondary | ICD-10-CM | POA: Insufficient documentation

## 2022-05-24 NOTE — Telephone Encounter (Signed)
Left message for patient to call me back  orders in epic for home health therapy she is schedule for knee replacement on 06/06/22 I left message for her to call me back, looks like she is in [facility], but is listed as a PO box, I need to know her address for home health  Also need to see if she wants [facility] or [facility] for her outpatient PT

## 2022-05-25 ENCOUNTER — Encounter: Payer: Self-pay | Admitting: Orthopedic Surgery

## 2022-05-25 NOTE — Telephone Encounter (Signed)
[address] [place]  She wants [facility] for the therapy

## 2022-05-25 NOTE — Telephone Encounter (Signed)
Patient returned your call, asks that you call her back.  [phone] or if no answer call cell [phone].

## 2022-05-25 NOTE — Addendum Note (Signed)
Addended byCaffie Damme on: 05/25/2022 11:36 AM   Modules accepted: Orders

## 2022-06-05 ENCOUNTER — Telehealth: Payer: Self-pay | Admitting: Orthopedic Surgery

## 2022-06-05 NOTE — Telephone Encounter (Signed)
Call from patient; states has not heard anything regarding surgery for her left knee, total knee replacement, which she said is to be tomorrow; no pre-op noted. Please advise.

## 2022-06-06 ENCOUNTER — Other Ambulatory Visit: Payer: Self-pay | Admitting: Orthopedic Surgery

## 2022-06-06 DIAGNOSIS — M1712 Unilateral primary osteoarthritis, left knee: Secondary | ICD-10-CM

## 2022-06-06 DIAGNOSIS — I10 Essential (primary) hypertension: Secondary | ICD-10-CM

## 2022-06-06 DIAGNOSIS — Z01818 Encounter for other preprocedural examination: Secondary | ICD-10-CM

## 2022-06-06 MED ORDER — BUPIVACAINE-MELOXICAM ER 200-6 MG/7ML IJ SOLN: 400.0000 mg | Freq: Once | INTRAMUSCULAR | Status: DC

## 2022-06-06 NOTE — Telephone Encounter (Signed)
Carol Dunn can't see the orders from 04/17/22 I put in again,they are showing as duplicate. I don't know what happened  If you want we can put in a ticket.

## 2022-06-06 NOTE — Telephone Encounter (Signed)
The orders are in they were put in on 04/17/22 not sure why she didn't get a call from OR, I sent message to Eber Jones, we will RS to 06/20/22

## 2022-06-07 NOTE — Telephone Encounter (Signed)
Patient requests call back from Amy - please try house phone first [phone]; states really needs to speak with her as soon as possible.

## 2022-06-07 NOTE — Telephone Encounter (Signed)
I called her to see if the hospital has called her yet, they did not, I gave her the number to call Eber Jones  Her surgery is now on 06/20/22 she needs her FMLA forms updated

## 2022-06-09 ENCOUNTER — Encounter: Payer: Self-pay | Admitting: Orthopedic Surgery

## 2022-06-09 NOTE — Telephone Encounter (Signed)
On Wednesday, 06/07/22, I called back to patient; FMLA forms discussed and current work note is still good.

## 2022-06-13 ENCOUNTER — Encounter: Payer: Self-pay | Admitting: Orthopedic Surgery

## 2022-06-14 NOTE — Patient Instructions (Signed)
Carol Dunn  06/14/2022     @PREFPERIOPPHARMACY @   Your procedure is scheduled on  06/20/2022.   Report to [facility] at  0600  A.M.   Call this number if you have problems the morning of surgery:  [phone]   Remember:  Do not eat or drink after midnight.      Take these medicines the morning of surgery with A SIP OF WATER                      Antivert(if needed), mobic(if needed).     Do not wear jewelry, make-up or nail polish.  Do not wear lotions, powders, or perfumes, or deodorant.  Do not shave 48 hours prior to surgery.  Men may shave face and neck.  Do not bring valuables to the hospital.  [facility] is not responsible for any belongings or valuables.  Contacts, dentures or bridgework may not be worn into surgery.  Leave your suitcase in the car.  After surgery it may be brought to your room.  For patients admitted to the hospital, discharge time will be determined by your treatment team.  Patients discharged the day of surgery will not be allowed to drive home and must have someone with them for 24 hours.    Special instructions:   DO NOT smoke tobacco or vape for 24 hours before your procedure.  Please read over the following fact sheets that you were given. Pain Booklet, Coughing and Deep Breathing, Blood Transfusion Information, Total Joint Packet, MRSA Information, Surgical Site Infection Prevention, Anesthesia Post-op Instructions, and Care and Recovery After Surgery      Total Knee Replacement, Care After This sheet gives you information about how to care for yourself after your procedure. Your health care provider may also give you more specific instructions. If you have problems or questions, contact your health care provider. What can I expect after the procedure? After the procedure, it is common to have: Redness, pain, and swelling at the incision area. Stiffness. Discomfort. A small amount of blood or clear fluid coming  from your incision. Follow these instructions at home: Medicines Take over-the-counter and prescription medicines only as told by your health care provider. If you were prescribed a blood thinner (anticoagulant), take it as told by your health care provider. Ask your health care provider if the medicine prescribed to you: Requires you to avoid driving or using machinery. Can cause constipation. You may need to take these actions to prevent or treat constipation: Drink enough fluid to keep your urine pale yellow. Take over-the-counter or prescription medicines. Eat foods that are high in fiber, such as beans, whole grains, and fresh fruits and vegetables. Limit foods that are high in fat and processed sugars, such as fried or sweet foods. Incision care  Follow instructions from your health care provider about how to take care of your incision. Make sure you: Wash your hands with soap and water for at least 20 seconds before and after you change your bandage (dressing). If soap and water are not available, use hand sanitizer. Change your dressing as told by your health care provider. Leave stitches (sutures), staples, skin glue, or adhesive strips in place. These skin closures may need to stay in place for 2 weeks or longer. If adhesive strip edges start to loosen and curl up, you may trim the loose edges. Do not remove adhesive strips completely unless  your health care provider tells you to do that. Do not take baths, swim, or use a hot tub until your health care provider approves. Check your incision area every day for signs of infection. Check for: More redness, swelling, or pain. More fluid or blood. Warmth. Pus or a bad smell. Activity Rest as told by your health care provider. Avoid sitting for a long time without moving. Get up to take short walks every 1-2 hours. This is important to improve blood flow and breathing. Ask for help if you feel weak or unsteady. Follow instructions from  your health care provider about using a walker, crutches, or a cane. You may use your legs to support (bear) your body weight as told by your health care provider. Follow instructions about how much weight you may safely support on your affected leg (weight-bearing restrictions). A physical therapist may show you how to get out of a bed and chair and how to go up and down stairs. You will first do this with a walker, crutches, or a cane and then without any of these devices. Once you are able to walk without a limp, you may stop using a walker, crutches, or a cane. Do exercises as told by your health care provider or physical therapist. Avoid high-impact activities, including running, jumping rope, and doing jumping jacks. Do not play contact sports until your health care provider approves. Return to your normal activities as told by your health care provider. Ask your health care provider what activities are safe for you. Managing pain, stiffness, and swelling  If directed, put ice on your knee. To do this: Put ice in a plastic bag or use the icing device (cold flow pad) that you were given. Follow instructions from your health care provider about how to use the icing device. Place a towel between your skin and the bag or between your skin and the icing device. Leave the ice on for 20 minutes, 2-3 times a day. Remove the ice if your skin turns bright red. This is very important. If you cannot feel pain, heat, or cold, you have a greater risk of damage to the area. Move your toes often to reduce stiffness and swelling. Raise (elevate) your leg above the level of your heart while you are sitting or lying down. Use several pillows to keep your leg straight. Do not put a pillow just under the knee. If the knee is bent for a long time, this may lead to stiffness. Wear elastic knee support as told by your health care provider. Safety  To help prevent falls, keep floors clear of objects you may trip  over. Place items that you may need within easy reach. Wear an apron or tool belt with pockets for carrying objects. This leaves your hands free to help with your balance. Ask your health care provider when it is safe to drive. General instructions Wear compression stockings as told by your health care provider. These stockings help to prevent blood clots and reduce swelling in your legs. Continue with breathing exercises. This helps prevent lung infection. Do not use any products that contain nicotine or tobacco. These products include cigarettes, chewing tobacco, and vaping devices, such as e-cigarettes. These can delay healing after surgery. If you need help quitting, ask your health care provider. Tell your health care provider if you plan to have dental work. Also: Tell your dentist about your joint replacement. Ask your health care provider if there are any special instructions you  need to follow before having dental care and routine cleanings. Keep all follow-up visits. This is important. Contact a health care provider if: You have a fever or chills. You have a cough or feel short of breath. Your medicine is not controlling your pain. You have any of these signs of infection: More redness, swelling, or pain around your incision. More fluid or blood coming from your incision. Warmth coming from your incision. Pus or a bad smell coming from your incision. You fall. Get help right away if: You have severe pain. You have trouble breathing. You have chest pain. You have redness, swelling, pain, or warmth in your calf or leg. Your incision breaks open after sutures or staples are removed. These symptoms may represent a serious problem that is an emergency. Do not wait to see if the symptoms will go away. Get medical help right away. Call your local emergency services (911 in the [place] Do not drive yourself to the hospital. Summary After the procedure, it is common to have pain and  swelling at the incision area, a small amount of blood or fluid coming from your incision, and stiffness. Follow instructions from your health care provider about how to take care of your incision. Use crutches, a walker, or a cane as told by your health care provider. This information is not intended to replace advice given to you by your health care provider. Make sure you discuss any questions you have with your health care provider. Document Revised: 04/20/2020 Document Reviewed: 04/20/2020 Elsevier Patient Education  2023 [employer] General Anesthesia, Adult, Care After This sheet gives you information about how to care for yourself after your procedure. Your health care provider may also give you more specific instructions. If you have problems or questions, contact your health care provider. What can I expect after the procedure? After the procedure, the following side effects are common: Pain or discomfort at the IV site. Nausea. Vomiting. Sore throat. Trouble concentrating. Feeling cold or chills. Feeling weak or tired. Sleepiness and fatigue. Soreness and body aches. These side effects can affect parts of the body that were not involved in surgery. Follow these instructions at home: For the time period you were told by your health care provider:  Rest. Do not participate in activities where you could fall or become injured. Do not drive or use machinery. Do not drink alcohol. Do not take sleeping pills or medicines that cause drowsiness. Do not make important decisions or sign legal documents. Do not take care of children on your own. Eating and drinking Follow any instructions from your health care provider about eating or drinking restrictions. When you feel hungry, start by eating small amounts of foods that are soft and easy to digest (bland), such as toast. Gradually return to your regular diet. Drink enough fluid to keep your urine pale yellow. If you vomit,  rehydrate by drinking water, juice, or clear broth. General instructions If you have sleep apnea, surgery and certain medicines can increase your risk for breathing problems. Follow instructions from your health care provider about wearing your sleep device: Anytime you are sleeping, including during daytime naps. While taking prescription pain medicines, sleeping medicines, or medicines that make you drowsy. Have a responsible adult stay with you for the time you are told. It is important to have someone help care for you until you are awake and alert. Return to your normal activities as told by your health care provider. Ask your health care provider what  activities are safe for you. Take over-the-counter and prescription medicines only as told by your health care provider. If you smoke, do not smoke without supervision. Keep all follow-up visits as told by your health care provider. This is important. Contact a health care provider if: You have nausea or vomiting that does not get better with medicine. You cannot eat or drink without vomiting. You have pain that does not get better with medicine. You are unable to pass urine. You develop a skin rash. You have a fever. You have redness around your IV site that gets worse. Get help right away if: You have difficulty breathing. You have chest pain. You have blood in your urine or stool, or you vomit blood. Summary After the procedure, it is common to have a sore throat or nausea. It is also common to feel tired. Have a responsible adult stay with you for the time you are told. It is important to have someone help care for you until you are awake and alert. When you feel hungry, start by eating small amounts of foods that are soft and easy to digest (bland), such as toast. Gradually return to your regular diet. Drink enough fluid to keep your urine pale yellow. Return to your normal activities as told by your health care provider. Ask your  health care provider what activities are safe for you. This information is not intended to replace advice given to you by your health care provider. Make sure you discuss any questions you have with your health care provider. Document Revised: 07/15/2020 Document Reviewed: 02/12/2020 Elsevier Patient Education  2023 [employer] Spinal Anesthesia and Epidural Anesthesia, Care After This sheet gives you information about how to care for yourself after your procedure. Your doctor may also give you more specific instructions. If you have problems or questions, contact your doctor. What can I expect after the procedure? While the medicines you were given are still having effects, it is common to: Feel like you may vomit (nauseous). Vomit. Have a numb feeling or tingling in your legs. Have problems when you pee (urinate). Feel itchy. Follow these instructions at home: For the time period you were told by your doctor:  Rest. Do not do activities where you could fall or get hurt. Do not drive or use machines. Do not drink alcohol. Do not take sleeping pills or medicines that make you drowsy. Do not make big decisions or sign legal documents. Do not take care of children on your own. Eating and drinking If you vomit, wait a short time. When you can drink without vomiting, try to drink some water, juice, or clear soup. Drink enough fluid to keep your pee (urine) pale yellow. Make sure you do not feel like vomiting before you eat solid foods. Follow the diet that your doctor recommends. General instructions If you have sleep apnea, surgery and certain medicines can raise your risk for breathing problems. Follow instructions from your doctor about when to wear your sleep device. Have a responsible adult stay with you for the time you are told. It is important to have someone help care for you until you are awake and alert. Return to your normal activities as told by your doctor. Ask your doctor  what activities are safe for you. Take over-the-counter and prescription medicines only as told by your doctor. Do not use any products that contain nicotine or tobacco, such as cigarettes, e-cigarettes, and chewing tobacco. If you need help quitting, ask your doctor. Keep  all follow-up visits as told by your doctor. This is important. Contact a doctor if: It has been more than one day since your procedure and you feel like you may vomit or you are vomiting. You have a rash. Get help right away if: You have a fever. You have a headache that lasts a long time. You have a very bad headache. Your vision is blurry or you see two of a single object (double vision). You are dizzy or light-headed. You faint. Your arms or legs tingle, feel weak, or get numb. You have trouble breathing. You cannot pee. These symptoms may be an emergency. Get help right away. Call your local emergency services (911 in the [place] Do not wait to see if the symptoms will go away. Do not drive yourself to the hospital. Summary After the procedure, have a responsible adult stay with you at home. Do not do activities that might cause you to get hurt. Do not drive, use machinery, drink alcohol, or make important decisions as told by your doctor. Do not use products that contain nicotine or tobacco. Get help right away if you have a very bad headache, trouble breathing, or you cannot pee. This information is not intended to replace advice given to you by your health care provider. Make sure you discuss any questions you have with your health care provider. Document Revised: 07/15/2020 Document Reviewed: 12/18/2019 Elsevier Patient Education  2023 [employer] How to Use Chlorhexidine for Bathing Chlorhexidine gluconate (CHG) is a germ-killing (antiseptic) solution that is used to clean the skin. It can get rid of the bacteria that normally live on the skin and can keep them away for about 24 hours. To clean your skin  with CHG, you may be given: A CHG solution to use in the shower or as part of a sponge bath. A prepackaged cloth that contains CHG. Cleaning your skin with CHG may help lower the risk for infection: While you are staying in the intensive care unit of the hospital. If you have a vascular access, such as a central line, to provide short-term or long-term access to your veins. If you have a catheter to drain urine from your bladder. If you are on a ventilator. A ventilator is a machine that helps you breathe by moving air in and out of your lungs. After surgery. What are the risks? Risks of using CHG include: A skin reaction. Hearing loss, if CHG gets in your ears and you have a perforated eardrum. Eye injury, if CHG gets in your eyes and is not rinsed out. The CHG product catching fire. Make sure that you avoid smoking and flames after applying CHG to your skin. Do not use CHG: If you have a chlorhexidine allergy or have previously reacted to chlorhexidine. On babies younger than 72 months of age. How to use CHG solution Use CHG only as told by your health care provider, and follow the instructions on the label. Use the full amount of CHG as directed. Usually, this is one bottle. During a shower Follow these steps when using CHG solution during a shower (unless your health care provider gives you different instructions): Start the shower. Use your normal soap and shampoo to wash your face and hair. Turn off the shower or move out of the shower stream. Pour the CHG onto a clean washcloth. Do not use any type of brush or rough-edged sponge. Starting at your neck, lather your body down to your toes. Make sure you follow  these instructions: If you will be having surgery, pay special attention to the part of your body where you will be having surgery. Scrub this area for at least 1 minute. Do not use CHG on your head or face. If the solution gets into your ears or eyes, rinse them well with  water. Avoid your genital area. Avoid any areas of skin that have broken skin, cuts, or scrapes. Scrub your back and under your arms. Make sure to wash skin folds. Let the lather sit on your skin for 1-2 minutes or as long as told by your health care provider. Thoroughly rinse your entire body in the shower. Make sure that all body creases and crevices are rinsed well. Dry off with a clean towel. Do not put any substances on your body afterward--such as powder, lotion, or perfume--unless you are told to do so by your health care provider. Only use lotions that are recommended by the manufacturer. Put on clean clothes or pajamas. If it is the night before your surgery, sleep in clean sheets.  During a sponge bath Follow these steps when using CHG solution during a sponge bath (unless your health care provider gives you different instructions): Use your normal soap and shampoo to wash your face and hair. Pour the CHG onto a clean washcloth. Starting at your neck, lather your body down to your toes. Make sure you follow these instructions: If you will be having surgery, pay special attention to the part of your body where you will be having surgery. Scrub this area for at least 1 minute. Do not use CHG on your head or face. If the solution gets into your ears or eyes, rinse them well with water. Avoid your genital area. Avoid any areas of skin that have broken skin, cuts, or scrapes. Scrub your back and under your arms. Make sure to wash skin folds. Let the lather sit on your skin for 1-2 minutes or as long as told by your health care provider. Using a different clean, wet washcloth, thoroughly rinse your entire body. Make sure that all body creases and crevices are rinsed well. Dry off with a clean towel. Do not put any substances on your body afterward--such as powder, lotion, or perfume--unless you are told to do so by your health care provider. Only use lotions that are recommended by the  manufacturer. Put on clean clothes or pajamas. If it is the night before your surgery, sleep in clean sheets. How to use CHG prepackaged cloths Only use CHG cloths as told by your health care provider, and follow the instructions on the label. Use the CHG cloth on clean, dry skin. Do not use the CHG cloth on your head or face unless your health care provider tells you to. When washing with the CHG cloth: Avoid your genital area. Avoid any areas of skin that have broken skin, cuts, or scrapes. Before surgery Follow these steps when using a CHG cloth to clean before surgery (unless your health care provider gives you different instructions): Using the CHG cloth, vigorously scrub the part of your body where you will be having surgery. Scrub using a back-and-forth motion for 3 minutes. The area on your body should be completely wet with CHG when you are done scrubbing. Do not rinse. Discard the cloth and let the area air-dry. Do not put any substances on the area afterward, such as powder, lotion, or perfume. Put on clean clothes or pajamas. If it is the night before your  surgery, sleep in clean sheets.  For general bathing Follow these steps when using CHG cloths for general bathing (unless your health care provider gives you different instructions). Use a separate CHG cloth for each area of your body. Make sure you wash between any folds of skin and between your fingers and toes. Wash your body in the following order, switching to a new cloth after each step: The front of your neck, shoulders, and chest. Both of your arms, under your arms, and your hands. Your stomach and groin area, avoiding the genitals. Your right leg and foot. Your left leg and foot. The back of your neck, your back, and your buttocks. Do not rinse. Discard the cloth and let the area air-dry. Do not put any substances on your body afterward--such as powder, lotion, or perfume--unless you are told to do so by your health  care provider. Only use lotions that are recommended by the manufacturer. Put on clean clothes or pajamas. Contact a health care provider if: Your skin gets irritated after scrubbing. You have questions about using your solution or cloth. You swallow any chlorhexidine. Call your local poison control center ((772)040-2905 in the [place] Get help right away if: Your eyes itch badly, or they become very red or swollen. Your skin itches badly and is red or swollen. Your hearing changes. You have trouble seeing. You have swelling or tingling in your mouth or throat. You have trouble breathing. These symptoms may represent a serious problem that is an emergency. Do not wait to see if the symptoms will go away. Get medical help right away. Call your local emergency services (911 in the [place] Do not drive yourself to the hospital. Summary Chlorhexidine gluconate (CHG) is a germ-killing (antiseptic) solution that is used to clean the skin. Cleaning your skin with CHG may help to lower your risk for infection. You may be given CHG to use for bathing. It may be in a bottle or in a prepackaged cloth to use on your skin. Carefully follow your health care provider's instructions and the instructions on the product label. Do not use CHG if you have a chlorhexidine allergy. Contact your health care provider if your skin gets irritated after scrubbing. This information is not intended to replace advice given to you by your health care provider. Make sure you discuss any questions you have with your health care provider. Document Revised: 01/10/2021 Document Reviewed: 01/10/2021 Elsevier Patient Education  2023 [employer].

## 2022-06-16 ENCOUNTER — Encounter (HOSPITAL_COMMUNITY): Payer: Self-pay

## 2022-06-16 ENCOUNTER — Encounter (HOSPITAL_COMMUNITY)
Admission: RE | Admit: 2022-06-16 | Discharge: 2022-06-16 | Disposition: A | Discharge status: Home / Self Care | Payer: BC Managed Care – PPO | Source: Ambulatory Visit | Attending: Orthopedic Surgery | Admitting: Orthopedic Surgery

## 2022-06-16 VITALS — BP 125/87 | HR 88 | Temp 97.6°F | Resp 18 | Ht 62.0 in | Wt 183.0 lb

## 2022-06-16 DIAGNOSIS — I10 Essential (primary) hypertension: Secondary | ICD-10-CM | POA: Insufficient documentation

## 2022-06-16 DIAGNOSIS — Z01818 Encounter for other preprocedural examination: Secondary | ICD-10-CM | POA: Diagnosis not present

## 2022-06-16 DIAGNOSIS — M1710 Unilateral primary osteoarthritis, unspecified knee: Secondary | ICD-10-CM | POA: Insufficient documentation

## 2022-06-16 LAB — CBC WITH DIFFERENTIAL/PLATELET
Abs Immature Granulocytes: 0.01 10*3/uL (ref 0.00–0.07)
Basophils Absolute: 0.1 10*3/uL (ref 0.0–0.1)
Basophils Relative: 1 %
Eosinophils Absolute: 0.2 10*3/uL (ref 0.0–0.5)
Eosinophils Relative: 2 %
HCT: 38 % (ref 36.0–46.0)
Hemoglobin: 12.8 g/dL (ref 12.0–15.0)
Immature Granulocytes: 0 %
Lymphocytes Relative: 32 %
Lymphs Abs: 2.1 10*3/uL (ref 0.7–4.0)
MCH: 31 pg (ref 26.0–34.0)
MCHC: 33.7 g/dL (ref 30.0–36.0)
MCV: 92 fL (ref 80.0–100.0)
Monocytes Absolute: 0.7 10*3/uL (ref 0.1–1.0)
Monocytes Relative: 10 %
Neutro Abs: 3.5 10*3/uL (ref 1.7–7.7)
Neutrophils Relative %: 55 %
Platelets: 238 10*3/uL (ref 150–400)
RBC: 4.13 MIL/uL (ref 3.87–5.11)
RDW: 12.9 % (ref 11.5–15.5)
WBC: 6.5 10*3/uL (ref 4.0–10.5)
nRBC: 0 % (ref 0.0–0.2)

## 2022-06-16 LAB — PREPARE RBC (CROSSMATCH)

## 2022-06-16 LAB — BASIC METABOLIC PANEL
Anion gap: 5 (ref 5–15)
BUN: 16 mg/dL (ref 6–20)
CO2: 26 mmol/L (ref 22–32)
Calcium: 9.2 mg/dL (ref 8.9–10.3)
Chloride: 107 mmol/L (ref 98–111)
Creatinine, Ser: 0.7 mg/dL (ref 0.44–1.00)
GFR, Estimated: >60 mL/min (ref >60)
Glucose, Bld: 98 mg/dL (ref 70–99)
Potassium: 3.9 mmol/L (ref 3.5–5.1)
Sodium: 138 mmol/L (ref 135–145)

## 2022-06-16 LAB — SURGICAL PCR SCREEN
MRSA, PCR: NEGATIVE
Staphylococcus aureus: NEGATIVE

## 2022-06-19 NOTE — H&P (Signed)
TOTAL KNEE ADMISSION H&P  Patient is being admitted for left total knee arthroplasty.  Subjective:  Chief Complaint:left knee pain.  HPI: Carol Dunn, 56 y.o. female, has a history of pain and functional disability in the left knee due to arthritis and has failed non-surgical conservative treatments for greater than 12 weeks to includeNSAID's and/or analgesics and activity modification.  Onset of symptoms was gradual, starting 6 years ago with gradually worsening course since that time. The patient noted no past surgery on the left knee(s).  Patient currently rates pain in the left knee(s) at 8 out of 10 with activity. Patient has worsening of pain with activity and weight bearing, pain that interferes with activities of daily living, pain with passive range of motion, and crepitus.  Patient has evidence of subchondral cysts, subchondral sclerosis, periarticular osteophytes, and joint space narrowing by imaging studies. There is no active infection.  Patient Active Problem List   Diagnosis Date Noted   History of hysterectomy 05/24/2022   Mixed hyperlipidemia 05/24/2022   Osteoarthritis 05/24/2022   Pain in left knee 05/24/2022   Sciatica 05/24/2022   Prediabetes 03/25/2021   Hypertension    Primary localized osteoarthritis of right knee    Past Medical History:  Diagnosis Date   Headache    Hypertension    takes Lisinopril daily   Joint pain    Primary localized osteoarthritis of right knee     Past Surgical History:  Procedure Laterality Date   ABDOMINAL HYSTERECTOMY     CHOLECYSTECTOMY     COLONOSCOPY     JOINT REPLACEMENT  07/10/2016   right knee   NECK SURGERY     states piece of glass was in neck and had to get that out   OOPHORECTOMY     TOTAL KNEE ARTHROPLASTY Right 07/10/2016   Procedure: TOTAL KNEE ARTHROPLASTY;  Surgeon: Salvatore Marvel, MD;  Location: [facility] OR;  Service: Orthopedics;  Laterality: Right;    Current Facility-Administered Medications  Medication  Dose Route Frequency Provider Last Rate Last Admin   bupivacaine-meloxicam ER (ZYNRELEF) injection 400 mg  400 mg Infiltration Once Vickki Hearing, MD       bupivacaine-meloxicam ER (ZYNRELEF) injection 400 mg  400 mg Infiltration Once Vickki Hearing, MD       Current Outpatient Medications  Medication Sig Dispense Refill Last Dose   acetaminophen (TYLENOL) 650 MG CR tablet Take 650-1,300 mg by mouth every 8 (eight) hours as needed for pain.      amoxicillin (AMOXIL) 500 MG capsule Take 2,000 mg by mouth as directed. Take 4 capsules (2000 mg) by mouth 1 hour prior to dental appointments.      ascorbic acid (C 500) 500 MG tablet Take 500 mg by mouth in the morning.      atorvastatin (LIPITOR) 20 MG tablet Take 20 mg by mouth in the morning.      Cholecalciferol (QC VITAMIN D3) 125 MCG (5000 UT) TABS Take 5,000 Units by mouth in the morning.      Garlic 1000 MG CAPS Take 1,000 mg by mouth 3 (three) times a week.      lisinopril-hydrochlorothiazide (ZESTORETIC) 20-12.5 MG tablet Take 1 tablet by mouth in the morning.      meclizine (ANTIVERT) 25 MG tablet Take 25 mg by mouth 3 (three) times daily as needed for dizziness.      meloxicam (MOBIC) 15 MG tablet Take 15 mg by mouth daily as needed for pain.      Multiple  Vitamin (MULTIVITAMIN WITH MINERALS) TABS tablet Take 1 tablet by mouth daily. Women's Multivitamin 50+      valACYclovir (VALTREX) 500 MG tablet Take 500 mg by mouth 2 (two) times daily as needed (outbreak).      No Known Allergies  Social History   Tobacco Use   Smoking status: Some Days    Types: Cigarettes   Smokeless tobacco: Never   Tobacco comments:    Smokes 3 cigars per week  Substance Use Topics   Alcohol use: No    Family History  Problem Relation Age of Onset   Breast cancer Sister 50   Breast cancer Sister 100     Review of Systems constitutional symptoms no fever no malaise, eyes ears nose and throat negative, cardiovascular no chest pain respiratory  no shortness of breath GI no diarrhea GU no dysuria musculoskeletal as stated skin no rash neuro no tingling psych no depression endocrine no evidence of polyphasia heme no evidence of excessive bleeding allergic none immunologic none  Objective:  Physical Exam  Blood pressure in the office 150/88 pulse 114 height 5 2 weight 183 BMI 33  General appearance well-developed well-nourished no gross abnormalities  Cardiovascular normal pulse and perfusion normal color without edema  Neurologically normal sensation without loss no deficits and no pathologic reflexes  Psychological awake alert and oriented x3 mood affect normal  Skin no lacerations or ulcerations no nodularity palpable masses erythema.  Musculoskeletal right knee well-healed incision normal alignment no swelling  Left knee full extension 120 degrees of flexion stable in flexion extension with tenderness over the medial joint line effusion noted.  Vital signs in last 24 hours:    Labs:   Estimated body mass index is 33.47 kg/m as calculated from the following:   Height as of 06/16/22: 5\' 2"  (1.575 m).   Weight as of 06/16/22: 83 kg.   Imaging Review Plain radiographs demonstrate severe degenerative joint disease of the left knee(s). The overall alignment ismild varus. The bone quality appears to be excellent for age and reported activity level.      Assessment/Plan:  Left total knee arthroplasty  End stage arthritis, left knee   The patient history, physical examination, clinical judgment of the provider and imaging studies are consistent with end stage degenerative joint disease of the left knee(s) and total knee arthroplasty is deemed medically necessary. The treatment options including medical management, injection therapy arthroscopy and arthroplasty were discussed at length. The risks and benefits of total knee arthroplasty were presented and reviewed. The risks due to aseptic loosening, infection, stiffness,  patella tracking problems, thromboembolic complications and other imponderables were discussed. The patient acknowledged the explanation, agreed to proceed with the plan and consent was signed. Patient is being admitted for inpatient treatment for surgery, pain control, PT, OT, prophylactic antibiotics, VTE prophylaxis, progressive ambulation and ADL's and discharge planning. The patient is planning to be discharged home with home health services   Social setting 6 stairs husband at home who can help  Risk of surgery bleeding infection clot embolism pain stiffness  Alternative treatments discussed continued activity modification weight loss exercise injection  Pain management 6-week rule  Postop DME's arrange physical therapy arranged

## 2022-06-20 ENCOUNTER — Other Ambulatory Visit: Payer: Self-pay

## 2022-06-20 ENCOUNTER — Encounter (HOSPITAL_COMMUNITY)
Admission: RE | Disposition: A | Discharge status: Home / Self Care | Payer: Self-pay | Source: Home / Self Care | Attending: Orthopedic Surgery

## 2022-06-20 ENCOUNTER — Observation Stay (HOSPITAL_COMMUNITY): Payer: BC Managed Care – PPO

## 2022-06-20 ENCOUNTER — Encounter (HOSPITAL_COMMUNITY): Payer: Self-pay | Admitting: Orthopedic Surgery

## 2022-06-20 ENCOUNTER — Ambulatory Visit (HOSPITAL_COMMUNITY): Payer: BC Managed Care – PPO | Admitting: Anesthesiology

## 2022-06-20 ENCOUNTER — Observation Stay (HOSPITAL_COMMUNITY)
Admission: RE | Admit: 2022-06-20 | Discharge: 2022-06-21 | Disposition: A | Discharge status: Home / Self Care | Payer: BC Managed Care – PPO | Attending: Orthopedic Surgery | Admitting: Orthopedic Surgery

## 2022-06-20 DIAGNOSIS — Z96652 Presence of left artificial knee joint: Secondary | ICD-10-CM | POA: Diagnosis not present

## 2022-06-20 DIAGNOSIS — F17210 Nicotine dependence, cigarettes, uncomplicated: Secondary | ICD-10-CM | POA: Insufficient documentation

## 2022-06-20 DIAGNOSIS — Z79899 Other long term (current) drug therapy: Secondary | ICD-10-CM | POA: Insufficient documentation

## 2022-06-20 DIAGNOSIS — Z01818 Encounter for other preprocedural examination: Secondary | ICD-10-CM

## 2022-06-20 DIAGNOSIS — I10 Essential (primary) hypertension: Secondary | ICD-10-CM | POA: Insufficient documentation

## 2022-06-20 DIAGNOSIS — M1712 Unilateral primary osteoarthritis, left knee: Principal | ICD-10-CM | POA: Diagnosis present

## 2022-06-20 DIAGNOSIS — M1990 Unspecified osteoarthritis, unspecified site: Secondary | ICD-10-CM

## 2022-06-20 HISTORY — PX: TOTAL KNEE ARTHROPLASTY: SHX125

## 2022-06-20 LAB — PREPARE RBC (CROSSMATCH)

## 2022-06-20 LAB — ABO/RH: ABO/RH(D): B POS

## 2022-06-20 SURGERY — ARTHROPLASTY, KNEE, TOTAL
Anesthesia: Spinal | Site: Knee | Laterality: Left

## 2022-06-20 MED ORDER — MENTHOL 3 MG MT LOZG
1.0000 | LOZENGE | OROMUCOSAL | Status: DC | PRN
Start: 2022-06-20 — End: 2022-06-21

## 2022-06-20 MED ORDER — CHLORHEXIDINE GLUCONATE 0.12 % MT SOLN
15.0000 mL | Freq: Once | OROMUCOSAL | Status: AC
  Administered 2022-06-20: 15 mL via OROMUCOSAL

## 2022-06-20 MED ORDER — ADULT MULTIVITAMIN W/MINERALS CH
1.0000 | ORAL_TABLET | Freq: Every day | ORAL | Status: DC
  Administered 2022-06-20 – 2022-06-21 (×2): 1 via ORAL
  Filled 2022-06-20 (×2): qty 1

## 2022-06-20 MED ORDER — BUPIVACAINE-EPINEPHRINE (PF) 0.25% -1:200000 IJ SOLN
INTRAMUSCULAR | Status: DC | PRN
  Administered 2022-06-20: 30 mL via PERINEURAL

## 2022-06-20 MED ORDER — DEXAMETHASONE SODIUM PHOSPHATE 10 MG/ML IJ SOLN
INTRAMUSCULAR | Status: DC | PRN
  Administered 2022-06-20: 5 mg

## 2022-06-20 MED ORDER — HYDROCODONE-ACETAMINOPHEN 7.5-325 MG PO TABS
1.0000 | ORAL_TABLET | ORAL | Status: DC | PRN
  Administered 2022-06-20 – 2022-06-21 (×3): 1 via ORAL
  Filled 2022-06-20 (×3): qty 1

## 2022-06-20 MED ORDER — METHOCARBAMOL 1000 MG/10ML IJ SOLN
500.0000 mg | Freq: Once | INTRAVENOUS | Status: AC
  Administered 2022-06-20: 500 mg via INTRAVENOUS
  Filled 2022-06-20: qty 500

## 2022-06-20 MED ORDER — METOCLOPRAMIDE HCL 5 MG/ML IJ SOLN: 5.0000 mg | Freq: Three times a day (TID) | INTRAMUSCULAR | Status: DC | PRN

## 2022-06-20 MED ORDER — LISINOPRIL-HYDROCHLOROTHIAZIDE 20-12.5 MG PO TABS: 1.0000 | ORAL_TABLET | Freq: Every morning | ORAL | Status: DC

## 2022-06-20 MED ORDER — PREGABALIN 50 MG PO CAPS
50.0000 mg | ORAL_CAPSULE | Freq: Once | ORAL | Status: AC
  Administered 2022-06-20: 50 mg via ORAL
  Filled 2022-06-20: qty 1

## 2022-06-20 MED ORDER — FENTANYL CITRATE (PF) 100 MCG/2ML IJ SOLN
INTRAMUSCULAR | Status: AC
  Filled 2022-06-20: qty 2

## 2022-06-20 MED ORDER — MEPERIDINE HCL 50 MG/ML IJ SOLN: 6.2500 mg | INTRAMUSCULAR | Status: DC | PRN

## 2022-06-20 MED ORDER — BUPIVACAINE-MELOXICAM ER 200-6 MG/7ML IJ SOLN
INTRAMUSCULAR | Status: AC
  Filled 2022-06-20: qty 2

## 2022-06-20 MED ORDER — ACETAMINOPHEN 500 MG PO TABS
500.0000 mg | ORAL_TABLET | Freq: Four times a day (QID) | ORAL | Status: AC
  Administered 2022-06-20 – 2022-06-21 (×4): 500 mg via ORAL
  Filled 2022-06-20 (×4): qty 1

## 2022-06-20 MED ORDER — PHENOL 1.4 % MT LIQD: 1.0000 | OROMUCOSAL | Status: DC | PRN

## 2022-06-20 MED ORDER — PROPOFOL 500 MG/50ML IV EMUL
INTRAVENOUS | Status: DC | PRN
  Administered 2022-06-20: 75 ug/kg/min via INTRAVENOUS

## 2022-06-20 MED ORDER — ATORVASTATIN CALCIUM 20 MG PO TABS
20.0000 mg | ORAL_TABLET | Freq: Every morning | ORAL | Status: DC
  Administered 2022-06-21: 20 mg via ORAL
  Filled 2022-06-20: qty 1

## 2022-06-20 MED ORDER — 0.9 % SODIUM CHLORIDE (POUR BTL) OPTIME
TOPICAL | Status: DC | PRN
  Administered 2022-06-20: 1000 mL

## 2022-06-20 MED ORDER — MIDAZOLAM HCL 5 MG/5ML IJ SOLN
INTRAMUSCULAR | Status: DC | PRN
  Administered 2022-06-20: 2 mg via INTRAVENOUS

## 2022-06-20 MED ORDER — MORPHINE SULFATE (PF) 2 MG/ML IV SOLN
0.5000 mg | INTRAVENOUS | Status: DC | PRN
  Administered 2022-06-20 (×2): 1 mg via INTRAVENOUS
  Filled 2022-06-20 (×2): qty 1

## 2022-06-20 MED ORDER — TRANEXAMIC ACID-NACL 1000-0.7 MG/100ML-% IV SOLN
INTRAVENOUS | Status: AC
  Filled 2022-06-20: qty 100

## 2022-06-20 MED ORDER — CEFAZOLIN SODIUM-DEXTROSE 2-4 GM/100ML-% IV SOLN
INTRAVENOUS | Status: AC
  Administered 2022-06-20: 2 g via INTRAVENOUS
  Filled 2022-06-20: qty 100

## 2022-06-20 MED ORDER — LIDOCAINE HCL (PF) 1 % IJ SOLN
INTRAMUSCULAR | Status: DC | PRN
  Administered 2022-06-20: 2 mL

## 2022-06-20 MED ORDER — DOCUSATE SODIUM 100 MG PO CAPS
100.0000 mg | ORAL_CAPSULE | Freq: Two times a day (BID) | ORAL | Status: DC
  Administered 2022-06-20 – 2022-06-21 (×3): 100 mg via ORAL
  Filled 2022-06-20 (×3): qty 1

## 2022-06-20 MED ORDER — DIPHENHYDRAMINE HCL 12.5 MG/5ML PO ELIX: 12.5000 mg | ORAL_SOLUTION | ORAL | Status: DC | PRN

## 2022-06-20 MED ORDER — DEXAMETHASONE SODIUM PHOSPHATE 10 MG/ML IJ SOLN
10.0000 mg | Freq: Once | INTRAMUSCULAR | Status: AC
  Administered 2022-06-21: 10 mg via INTRAVENOUS
  Filled 2022-06-20: qty 1

## 2022-06-20 MED ORDER — PROPOFOL 10 MG/ML IV BOLUS
INTRAVENOUS | Status: AC
  Filled 2022-06-20: qty 20

## 2022-06-20 MED ORDER — MIDAZOLAM HCL 2 MG/2ML IJ SOLN
INTRAMUSCULAR | Status: AC
  Filled 2022-06-20: qty 2

## 2022-06-20 MED ORDER — BUPIVACAINE-EPINEPHRINE (PF) 0.25% -1:200000 IJ SOLN
INTRAMUSCULAR | Status: AC
  Filled 2022-06-20: qty 30

## 2022-06-20 MED ORDER — ONDANSETRON HCL 4 MG/2ML IJ SOLN
4.0000 mg | Freq: Once | INTRAMUSCULAR | Status: AC
  Administered 2022-06-20: 4 mg via INTRAVENOUS
  Filled 2022-06-20: qty 2

## 2022-06-20 MED ORDER — LACTATED RINGERS IV SOLN
INTRAVENOUS | Status: DC
Start: 2022-06-20 — End: 2022-06-20

## 2022-06-20 MED ORDER — LISINOPRIL 10 MG PO TABS
20.0000 mg | ORAL_TABLET | Freq: Every day | ORAL | Status: DC
  Administered 2022-06-21: 20 mg via ORAL
  Filled 2022-06-20: qty 2

## 2022-06-20 MED ORDER — DEXAMETHASONE SODIUM PHOSPHATE 10 MG/ML IJ SOLN
INTRAMUSCULAR | Status: AC
  Filled 2022-06-20: qty 3

## 2022-06-20 MED ORDER — ASPIRIN 81 MG PO CHEW
81.0000 mg | CHEWABLE_TABLET | Freq: Two times a day (BID) | ORAL | Status: DC
  Administered 2022-06-20 – 2022-06-21 (×2): 81 mg via ORAL
  Filled 2022-06-20 (×2): qty 1

## 2022-06-20 MED ORDER — TRANEXAMIC ACID-NACL 1000-0.7 MG/100ML-% IV SOLN: 1000.0000 mg | Freq: Once | INTRAVENOUS | Status: DC

## 2022-06-20 MED ORDER — ACETAMINOPHEN 325 MG PO TABS: 325.0000 mg | ORAL_TABLET | Freq: Four times a day (QID) | ORAL | Status: DC | PRN

## 2022-06-20 MED ORDER — CELECOXIB 400 MG PO CAPS
400.0000 mg | ORAL_CAPSULE | Freq: Once | ORAL | Status: AC
  Administered 2022-06-20: 400 mg via ORAL
  Filled 2022-06-20: qty 1

## 2022-06-20 MED ORDER — ALUM & MAG HYDROXIDE-SIMETH 200-200-20 MG/5ML PO SUSP: 30.0000 mL | ORAL | Status: DC | PRN

## 2022-06-20 MED ORDER — METOCLOPRAMIDE HCL 10 MG PO TABS: 5.0000 mg | ORAL_TABLET | Freq: Three times a day (TID) | ORAL | Status: DC | PRN

## 2022-06-20 MED ORDER — VALACYCLOVIR HCL 500 MG PO TABS: 500.0000 mg | ORAL_TABLET | Freq: Two times a day (BID) | ORAL | Status: DC | PRN

## 2022-06-20 MED ORDER — TRAMADOL HCL 50 MG PO TABS
50.0000 mg | ORAL_TABLET | Freq: Four times a day (QID) | ORAL | Status: DC
  Administered 2022-06-20 – 2022-06-21 (×5): 50 mg via ORAL
  Filled 2022-06-20 (×5): qty 1

## 2022-06-20 MED ORDER — BUPIVACAINE-MELOXICAM ER 200-6 MG/7ML IJ SOLN
INTRAMUSCULAR | Status: DC | PRN
  Administered 2022-06-20: 400 mg

## 2022-06-20 MED ORDER — ONDANSETRON HCL 4 MG/2ML IJ SOLN: 4.0000 mg | Freq: Once | INTRAMUSCULAR | Status: DC | PRN

## 2022-06-20 MED ORDER — ONDANSETRON HCL 4 MG PO TABS: 4.0000 mg | ORAL_TABLET | Freq: Four times a day (QID) | ORAL | Status: DC | PRN

## 2022-06-20 MED ORDER — ORAL CARE MOUTH RINSE: 15.0000 mL | Freq: Once | OROMUCOSAL | Status: AC

## 2022-06-20 MED ORDER — CEFAZOLIN SODIUM-DEXTROSE 2-4 GM/100ML-% IV SOLN
2.0000 g | Freq: Four times a day (QID) | INTRAVENOUS | Status: AC
  Administered 2022-06-20: 2 g via INTRAVENOUS
  Filled 2022-06-20 (×2): qty 100

## 2022-06-20 MED ORDER — POLYETHYLENE GLYCOL 3350 17 G PO PACK
17.0000 g | PACK | Freq: Every day | ORAL | Status: DC
Start: 2022-06-20 — End: 2022-06-21
  Administered 2022-06-21: 17 g via ORAL
  Filled 2022-06-20: qty 1

## 2022-06-20 MED ORDER — MECLIZINE HCL 12.5 MG PO TABS
25.0000 mg | ORAL_TABLET | Freq: Three times a day (TID) | ORAL | Status: DC | PRN
Start: 2022-06-20 — End: 2022-06-21

## 2022-06-20 MED ORDER — POVIDONE-IODINE 10 % EX SWAB: 2.0000 | Freq: Once | CUTANEOUS | Status: DC

## 2022-06-20 MED ORDER — SODIUM CHLORIDE 0.9 % IV SOLN: INTRAVENOUS | Status: DC

## 2022-06-20 MED ORDER — TRANEXAMIC ACID-NACL 1000-0.7 MG/100ML-% IV SOLN: 1000.0000 mg | INTRAVENOUS | Status: AC

## 2022-06-20 MED ORDER — HYDROMORPHONE HCL 1 MG/ML IJ SOLN: 0.2500 mg | INTRAMUSCULAR | Status: DC | PRN

## 2022-06-20 MED ORDER — POVIDONE-IODINE 10 % EX SWAB
1.0000 | Freq: Once | CUTANEOUS | Status: DC
  Filled 2022-06-20: qty 1

## 2022-06-20 MED ORDER — HYDROCHLOROTHIAZIDE 12.5 MG PO TABS
12.5000 mg | ORAL_TABLET | Freq: Every day | ORAL | Status: DC
  Administered 2022-06-21: 12.5 mg via ORAL
  Filled 2022-06-20: qty 1

## 2022-06-20 MED ORDER — SODIUM CHLORIDE 0.9 % IR SOLN
Status: DC | PRN
  Administered 2022-06-20: 3000 mL

## 2022-06-20 MED ORDER — LIDOCAINE HCL (PF) 1 % IJ SOLN
INTRAMUSCULAR | Status: AC
  Filled 2022-06-20: qty 2

## 2022-06-20 MED ORDER — CEFAZOLIN SODIUM-DEXTROSE 2-4 GM/100ML-% IV SOLN
2.0000 g | INTRAVENOUS | Status: AC
  Administered 2022-06-21: 2 g via INTRAVENOUS
  Filled 2022-06-20: qty 100

## 2022-06-20 MED ORDER — ONDANSETRON HCL 4 MG/2ML IJ SOLN
INTRAMUSCULAR | Status: DC | PRN
  Administered 2022-06-20: 4 mg via INTRAVENOUS

## 2022-06-20 MED ORDER — FENTANYL CITRATE (PF) 100 MCG/2ML IJ SOLN
INTRAMUSCULAR | Status: DC | PRN
  Administered 2022-06-20: 100 ug via INTRAVENOUS

## 2022-06-20 MED ORDER — ONDANSETRON HCL 4 MG/2ML IJ SOLN: 4.0000 mg | Freq: Four times a day (QID) | INTRAMUSCULAR | Status: DC | PRN

## 2022-06-20 MED ORDER — CEFAZOLIN SODIUM-DEXTROSE 2-4 GM/100ML-% IV SOLN
2.0000 g | INTRAVENOUS | Status: AC
  Administered 2022-06-20: 2 g via INTRAVENOUS

## 2022-06-20 MED ORDER — BUPIVACAINE IN DEXTROSE 0.75-8.25 % IT SOLN
INTRATHECAL | Status: DC | PRN
  Administered 2022-06-20: 1.7 mL via INTRATHECAL

## 2022-06-20 MED ORDER — OXYCODONE HCL 5 MG PO TABS
5.0000 mg | ORAL_TABLET | Freq: Once | ORAL | Status: AC
  Administered 2022-06-20: 5 mg via ORAL
  Filled 2022-06-20: qty 1

## 2022-06-20 MED ORDER — TRANEXAMIC ACID-NACL 1000-0.7 MG/100ML-% IV SOLN
1000.0000 mg | INTRAVENOUS | Status: AC
  Administered 2022-06-20: 1000 mg via INTRAVENOUS

## 2022-06-20 SURGICAL SUPPLY — 65 items
ATTUNE PSFEM LTSZ5 NARCEM KNEE (Femur) ×1 IMPLANT
BANDAGE ESMARK 6X9 LF (GAUZE/BANDAGES/DRESSINGS) ×1 IMPLANT
BASEPLATE TIB CMT FB PCKT SZ4 (Stem) ×1 IMPLANT
BLADE SAGITTAL 25.0X1.27X90 (BLADE) ×2 IMPLANT
BLADE SAW SGTL 11.0X1.19X90.0M (BLADE) ×2 IMPLANT
BLADE SURG SZ10 CARB STEEL (BLADE) ×2 IMPLANT
BNDG ELASTIC 4X5.8 VLCR NS LF (GAUZE/BANDAGES/DRESSINGS) ×2 IMPLANT
BNDG ELASTIC 6X5.8 VLCR NS LF (GAUZE/BANDAGES/DRESSINGS) ×1 IMPLANT
BNDG ESMARK 6X9 LF (GAUZE/BANDAGES/DRESSINGS) ×2
CEMENT HV SMART SET (Cement) ×4 IMPLANT
CLOTH BEACON ORANGE TIMEOUT ST (SAFETY) ×2 IMPLANT
COOLER ICEMAN CLASSIC (MISCELLANEOUS) ×2 IMPLANT
COVER LIGHT HANDLE STERIS (MISCELLANEOUS) ×4 IMPLANT
CUFF TOURN SGL QUICK 34 (TOURNIQUET CUFF) ×1
CUFF TRNQT CYL 34X4.125X (TOURNIQUET CUFF) ×1 IMPLANT
DRAPE BACK TABLE (DRAPES) ×2 IMPLANT
DRAPE EXTREMITY T 121X128X90 (DISPOSABLE) ×2 IMPLANT
DRESSING AQUACEL AG ADV 3.5X12 (MISCELLANEOUS) ×1 IMPLANT
DRSG AQUACEL AG ADV 3.5X12 (MISCELLANEOUS) ×2
DURAPREP 26ML APPLICATOR (WOUND CARE) ×4 IMPLANT
ELECT REM PT RETURN 9FT ADLT (ELECTROSURGICAL) ×2
ELECTRODE REM PT RTRN 9FT ADLT (ELECTROSURGICAL) ×1 IMPLANT
GLOVE BIO SURGEON STRL SZ7 (GLOVE) ×1 IMPLANT
GLOVE BIOGEL PI IND STRL 6.5 (GLOVE) IMPLANT
GLOVE BIOGEL PI IND STRL 7.0 (GLOVE) ×2 IMPLANT
GLOVE BIOGEL PI IND STRL 8.5 (GLOVE) ×1 IMPLANT
GLOVE BIOGEL PI INDICATOR 6.5 (GLOVE) ×1
GLOVE BIOGEL PI INDICATOR 7.0 (GLOVE) ×3
GLOVE BIOGEL PI INDICATOR 8.5 (GLOVE) ×1
GLOVE ECLIPSE 6.5 STRL STRAW (GLOVE) ×1 IMPLANT
GLOVE SKINSENSE NS SZ8.0 LF (GLOVE) ×1
GLOVE SKINSENSE STRL SZ8.0 LF (GLOVE) ×1 IMPLANT
GLOVE SURG SS PI 6.5 STRL IVOR (GLOVE) ×1 IMPLANT
GOWN STRL REUS W/TWL LRG LVL3 (GOWN DISPOSABLE) ×6 IMPLANT
GOWN STRL REUS W/TWL XL LVL3 (GOWN DISPOSABLE) ×2 IMPLANT
HANDPIECE INTERPULSE COAX TIP (DISPOSABLE) ×1
HOOD W/PEELAWAY (MISCELLANEOUS) ×8 IMPLANT
INSERT TIB ATTUNE FB SZ5X7 (Insert) ×1 IMPLANT
INST SET MAJOR BONE (KITS) ×2 IMPLANT
IV NS IRRIG 3000ML ARTHROMATIC (IV SOLUTION) ×2 IMPLANT
KIT BLADEGUARD II DBL (SET/KITS/TRAYS/PACK) ×2 IMPLANT
KIT TURNOVER KIT A (KITS) ×2 IMPLANT
MANIFOLD NEPTUNE II (INSTRUMENTS) ×2 IMPLANT
MARKER SKIN DUAL TIP RULER LAB (MISCELLANEOUS) ×2 IMPLANT
NS IRRIG 1000ML POUR BTL (IV SOLUTION) ×2 IMPLANT
PACK TOTAL JOINT (CUSTOM PROCEDURE TRAY) ×2 IMPLANT
PAD ARMBOARD 7.5X6 YLW CONV (MISCELLANEOUS) ×2 IMPLANT
PAD COLD SHLDR SM WRAP-ON (PAD) ×2 IMPLANT
PATELLA MEDIAL ATTUN 35MM KNEE (Knees) ×1 IMPLANT
PILLOW KNEE EXTENSION 0 DEG (MISCELLANEOUS) ×2 IMPLANT
PIN/DRILL PACK ORTHO 1/8X3.0 (PIN) ×2 IMPLANT
SAW OSC TIP CART 19.5X105X1.3 (SAW) ×2 IMPLANT
SET BASIN LINEN APH (SET/KITS/TRAYS/PACK) ×2 IMPLANT
SET HNDPC FAN SPRY TIP SCT (DISPOSABLE) ×1 IMPLANT
STAPLER VISISTAT 35W (STAPLE) ×2 IMPLANT
SUT BRALON NAB BRD #1 30IN (SUTURE) ×3 IMPLANT
SUT MNCRL 0 VIOLET CTX 36 (SUTURE) ×1 IMPLANT
SUT MON AB 0 CT1 (SUTURE) ×2 IMPLANT
SUT MONOCRYL 0 CTX 36 (SUTURE) ×1
SYR BULB IRRIG 60ML STRL (SYRINGE) ×2 IMPLANT
TOWEL OR 17X26 4PK STRL BLUE (TOWEL DISPOSABLE) ×2 IMPLANT
TOWER CARTRIDGE SMART MIX (DISPOSABLE) ×2 IMPLANT
TRAY FOLEY MTR SLVR 16FR STAT (SET/KITS/TRAYS/PACK) ×2 IMPLANT
WATER STERILE IRR 1000ML POUR (IV SOLUTION) ×4 IMPLANT
YANKAUER SUCT 12FT TUBE ARGYLE (SUCTIONS) ×2 IMPLANT

## 2022-06-20 NOTE — Anesthesia Procedure Notes (Signed)
Anesthesia Regional Block: Adductor canal block   Pre-Anesthetic Checklist: , timeout performed,  Correct Patient, Correct Site, Correct Laterality,  Correct Procedure, Correct Position, site marked,  Risks and benefits discussed,  Surgical consent,  Pre-op evaluation,  At surgeon's request and post-op pain management  Laterality: Left  Prep: chloraprep       Needles:  Injection technique: Single-shot  Needle Type: Stimulator Needle - 80     Needle Length: 10cm  Needle Gauge: 20   Needle insertion depth: 8 cm   Additional Needles:   Procedures:, nerve stimulator,,, ultrasound used (permanent image in chart),,     Nerve Stimulator or Paresthesia:  Response: no Twitch elicited, 0.6 mA, 8 cm  Additional Responses:   Narrative:  Start time: 06/20/2022 7:20 AM End time: 06/20/2022 7:28 AM Injection made incrementally with aspirations every 5 mL.  Performed by: Personally  Anesthesiologist: Molli Barrows, MD  Additional Notes: BP cuff, EKG monitors applied. Sedation begun. After nerve location anesthetic injected incrementally, slowly , and after neg aspirations. Tolerated well.

## 2022-06-20 NOTE — Anesthesia Procedure Notes (Signed)
Spinal  Start time: 06/20/2022 7:44 AM End time: 06/20/2022 7:50 AM Reason for block: surgical anesthesia Staffing Performed: other anesthesia staff and resident/CRNA  Resident/CRNA: Lucinda Dell, CRNA Other anesthesia staff: Alphonse Guild, RN Performed by: Garnet Koyanagi, RN Authorized by: Molli Barrows, MD   Preanesthetic Checklist Completed: patient identified, IV checked, site marked, risks and benefits discussed, surgical consent, monitors and equipment checked, pre-op evaluation and timeout performed Spinal Block Patient position: sitting Prep: DuraPrep Patient monitoring: heart rate, cardiac monitor, continuous pulse ox and blood pressure Approach: midline Location: L3-4 Injection technique: single-shot Needle Needle type: Pencan  Needle gauge: 24 G Needle length: 10 cm Needle insertion depth: 7 cm Assessment Sensory level: T8 Events: CSF return

## 2022-06-20 NOTE — Plan of Care (Signed)
  Problem: Acute Rehab PT Goals(only PT should resolve) Goal: Pt Will Go Supine/Side To Sit Outcome: Progressing Flowsheets (Taken 06/20/2022 1534) Pt will go Supine/Side to Sit:  with modified independence  Independently Goal: Patient Will Transfer Sit To/From Stand Outcome: Progressing Flowsheets (Taken 06/20/2022 1534) Patient will transfer sit to/from stand: with modified independence Goal: Pt Will Transfer Bed To Chair/Chair To Bed Outcome: Progressing Flowsheets (Taken 06/20/2022 1534) Pt will Transfer Bed to Chair/Chair to Bed: with modified independence Goal: Pt Will Ambulate Outcome: Progressing Flowsheets (Taken 06/20/2022 1534) Pt will Ambulate:  > 125 feet  with modified independence  with rolling walker   3:35 PM, 06/20/22 Ocie Bob, MPT Physical Therapist with [facility] [address] [phone] office [phone] mobile phone

## 2022-06-20 NOTE — Anesthesia Preprocedure Evaluation (Signed)
Anesthesia Evaluation  Patient identified by MRN, date of birth, ID band Patient awake    Reviewed: Allergy & Precautions, NPO status , Patient's Chart, lab work & pertinent test results  Airway Mallampati: II  TM Distance: >3 FB Neck ROM: Full   Comment: NEC K SX Dental  (+) Dental Advisory Given, Missing   Pulmonary Current Smoker and Patient abstained from smoking.,    Pulmonary exam normal breath sounds clear to auscultation       Cardiovascular Exercise Tolerance: Good hypertension, Pt. on medications Normal cardiovascular exam Rhythm:Regular Rate:Normal  16-Jun-2022 10:42:36 [facility] ROUTINE RECORD 1966/09/28 (56 yr) Female Black Vent. rate 90 BPM PR interval 164 ms QRS duration 84 ms QT/QTcB 354/433 ms P-R-T axes 64 83 29 Normal sinus rhythm Normal ECG When compared with ECG of 30-Jun-2016 11:40, No significant change since last tracing PREVIOUS ECG IS PRESENT Confirmed by Carylon Perches [phone]) on 06/18/2022 3:37:43 PM   Neuro/Psych  Headaches,  Neuromuscular disease negative psych ROS   GI/Hepatic negative GI ROS, Neg liver ROS,   Endo/Other  negative endocrine ROS  Renal/GU negative Renal ROS  negative genitourinary   Musculoskeletal  (+) Arthritis , Osteoarthritis,    Abdominal   Peds negative pediatric ROS (+)  Hematology negative hematology ROS (+)   Anesthesia Other Findings   Reproductive/Obstetrics negative OB ROS                            Anesthesia Physical Anesthesia Plan  ASA: 2  Anesthesia Plan: Spinal   Post-op Pain Management: Dilaudid IV and Regional block*   Induction:   PONV Risk Score and Plan: 2 and Ondansetron, Dexamethasone and Midazolam  Airway Management Planned: Nasal Cannula, Natural Airway and Simple Face Mask  Additional Equipment:   Intra-op Plan:   Post-operative Plan:   Informed Consent: I have reviewed the  patients History and Physical, chart, labs and discussed the procedure including the risks, benefits and alternatives for the proposed anesthesia with the patient or authorized representative who has indicated his/her understanding and acceptance.     Dental advisory given  Plan Discussed with: CRNA and Surgeon  Anesthesia Plan Comments:         Anesthesia Quick Evaluation

## 2022-06-20 NOTE — Evaluation (Signed)
Physical Therapy Evaluation Patient Details Name: Carol Dunn MRN: [id number] DOB: 1966/10/20 Today's Date: 06/20/2022   LEFT KNEE ROM: 0 - 95 degrees AMBULATION DISTANCE: 120 feet using RW with Mod Independent/Supervision   History of Present Illness  Carol Don Thompsonis a 56 y/o female, s/p Left TKA on 06/20/22 with the diagnosis of Left knee osteoarthritis.  Clinical Impression  Patient reviewed in and given written instructions for HEP with good return demonstrated and understanding acknowledged.  Patient demonstrates good return for moving LLE during bed mobility, fair/good tolerance for weightbearing on LLE with left heel to toe stepping during gait training without loss of balance and tolerated sitting up in chair with LLE dangling after therapy with her spouse present in room.  Patient will benefit from continued skilled physical therapy in hospital and recommended venue below to increase strength, balance, endurance for safe ADLs and gait.      Recommendations for follow up therapy are one component of a multi-disciplinary discharge planning process, led by the attending physician.  Recommendations may be updated based on patient status, additional functional criteria and insurance authorization.  Follow Up Recommendations Home health PT      Assistance Recommended at Discharge Set up Supervision/Assistance  Patient can return home with the following  A little help with walking and/or transfers;A little help with bathing/dressing/bathroom;Help with stairs or ramp for entrance;Assistance with cooking/housework    Equipment Recommendations None recommended by PT  Recommendations for Other Services       Functional Status Assessment Patient has had a recent decline in their functional status and demonstrates the ability to make significant improvements in function in a reasonable and predictable amount of time.     Precautions / Restrictions Precautions Precautions:  Fall Restrictions Weight Bearing Restrictions: Yes LLE Weight Bearing: Weight bearing as tolerated      Mobility  Bed Mobility Overal bed mobility: Modified Independent                  Transfers Overall transfer level: Needs assistance Equipment used: Rolling walker (2 wheels) Transfers: Sit to/from Stand, Bed to chair/wheelchair/BSC Sit to Stand: Supervision, Modified independent (Device/Increase time)   Step pivot transfers: Supervision, Modified independent (Device/Increase time)       General transfer comment: slightly labored movement    Ambulation/Gait Ambulation/Gait assistance: Supervision, Modified independent (Device/Increase time) Gait Distance (Feet): 120 Feet Assistive device: Rolling walker (2 wheels) Gait Pattern/deviations: Decreased step length - right, Decreased step length - left, Decreased stance time - left, Decreased stride length, Antalgic Gait velocity: decreased     General Gait Details: demonstrates fair/good return for left heel to toe stepping with mild antalgic gait on LLE, no loss of balance with good tolerance for walking in hallways  Stairs            Wheelchair Mobility    Modified Rankin (Stroke Patients Only)       Balance Overall balance assessment: Needs assistance Sitting-balance support: Feet supported, No upper extremity supported Sitting balance-Leahy Scale: Good Sitting balance - Comments: seated at EOB   Standing balance support: During functional activity, Bilateral upper extremity supported Standing balance-Leahy Scale: Fair Standing balance comment: fair/good using RW                             Pertinent Vitals/Pain Pain Assessment Pain Assessment: 0-10 Pain Score: 6  Pain Location: left knee Pain Descriptors / Indicators: Sore, Grimacing Pain Intervention(s): Limited activity  within patient's tolerance, Monitored during session, Repositioned    Home Living Family/patient expects to  be discharged to:: Private residence Living Arrangements: Spouse/significant other Available Help at Discharge: Family;Available 24 hours/day Type of Home: House Home Access: Stairs to enter Entrance Stairs-Rails: Right;Left;Can reach both Entrance Stairs-Number of Steps: 6 Alternate Level Stairs-Number of Steps: 10-11 steps to basement Home Layout: Two level;Able to live on main level with bedroom/bathroom;Full bath on main level Home Equipment: Rolling Walker (2 wheels);BSC/3in1;Shower seat      Prior Function Prior Level of Function : Independent/Modified Independent             Mobility Comments: Tourist information centre manager, drives ADLs Comments: Independent     Hand Dominance   Dominant Hand: Right    Extremity/Trunk Assessment   Upper Extremity Assessment Upper Extremity Assessment: Overall WFL for tasks assessed    Lower Extremity Assessment Lower Extremity Assessment: Overall WFL for tasks assessed;LLE deficits/detail LLE Deficits / Details: grossly 4/5 LLE: Unable to fully assess due to pain LLE Sensation: WNL LLE Coordination: WNL    Cervical / Trunk Assessment Cervical / Trunk Assessment: Normal  Communication   Communication: No difficulties  Cognition Arousal/Alertness: Awake/alert Behavior During Therapy: WFL for tasks assessed/performed Overall Cognitive Status: Within Functional Limits for tasks assessed                                          General Comments      Exercises Total Joint Exercises Ankle Circles/Pumps: Supine, 10 reps, Left, Strengthening, AROM Quad Sets: AROM, Strengthening, Left, 10 reps, Supine Short Arc Quad: AROM, Strengthening, Left, 10 reps, Supine Heel Slides: Supine, 10 reps, Left, Strengthening, AROM Goniometric ROM: left knee: 0 - 95 degrees   Assessment/Plan    PT Assessment Patient needs continued PT services  PT Problem List Decreased strength;Decreased range of motion;Decreased activity  tolerance;Decreased balance;Decreased mobility       PT Treatment Interventions DME instruction;Gait training;Stair training;Functional mobility training;Therapeutic activities;Therapeutic exercise;Patient/family education;Balance training    PT Goals (Current goals can be found in the Care Plan section)  Acute Rehab PT Goals Patient Stated Goal: return home with family to assist PT Goal Formulation: With patient/family Time For Goal Achievement: 06/22/22 Potential to Achieve Goals: Good    Frequency BID     Co-evaluation               AM-PAC PT "6 Clicks" Mobility  Outcome Measure Help needed turning from your back to your side while in a flat bed without using bedrails?: None Help needed moving from lying on your back to sitting on the side of a flat bed without using bedrails?: None Help needed moving to and from a bed to a chair (including a wheelchair)?: A Little Help needed standing up from a chair using your arms (e.g., wheelchair or bedside chair)?: A Little Help needed to walk in hospital room?: A Little Help needed climbing 3-5 steps with a railing? : A Little 6 Click Score: 20    End of Session   Activity Tolerance: Patient tolerated treatment well;Patient limited by pain Patient left: in chair;with call bell/phone within reach;with family/visitor present Nurse Communication: Mobility status PT Visit Diagnosis: Unsteadiness on feet (R26.81);Other abnormalities of gait and mobility (R26.89);Muscle weakness (generalized) (M62.81)    Time: 0814-4818 PT Time Calculation (min) (ACUTE ONLY): 31 min   Charges:   PT Evaluation $PT Eval Moderate  Complexity: 1 Mod PT Treatments $Therapeutic Activity: 23-37 mins        3:33 PM, 06/20/22 Ocie Bob, MPT Physical Therapist with [facility] [phone] office [phone] mobile phone

## 2022-06-20 NOTE — Interval H&P Note (Signed)
History and Physical Interval Note:  06/20/2022 7:18 AM  Carol Dunn  has presented today for surgery, with the diagnosis of Left knee osteoarthritis.  The various methods of treatment have been discussed with the patient and family. After consideration of risks, benefits and other options for treatment, the patient has consented to  Procedure(s): TOTAL KNEE ARTHROPLASTY (Left) as a surgical intervention.  The patient's history has been reviewed, patient examined, no change in status, stable for surgery.  I have reviewed the patient's chart and labs.  Questions were answered to the patient's satisfaction.     Fuller [place]

## 2022-06-20 NOTE — Op Note (Signed)
06/20/2022  10:08 AM  PATIENT:  Carol Dunn  56 y.o. female  PRE-OPERATIVE DIAGNOSIS:  Left knee osteoarthritis  POST-OPERATIVE DIAGNOSIS:  Left knee osteoarthritis  PROCEDURE:  Procedure(s): TOTAL KNEE ARTHROPLASTY (Left)  Operative findings Preop flexion passive test 105 degrees.  Fixed varus mild less than 10 degrees.  Intra-Op findings grade 4 wear medial compartment tight medial soft tissue sleeve lateral compartment normal ACL PCL intact, osteophytes in the notch.  Medial meniscus lateral meniscus intact.  Patellofemoral joint grade 2 arthritis  Implants size 5 narrow femur  size 4 tibia Size 35 patella (by 9.5) Polyethylene 7 PS fixed-bearing  Bone cuts 11 mm from distal femur  3 mm from medial proximal tibia  9.5 mm from patella  Details of surgery.   Preop evaluation was completed and the patient was cleared for surgery hemoglobin 12.8 surgical site identified confirmed and marked radiographs reviewed implants checked with sales rep  The patient was given a preoperative saphenous nerve block and taken to surgery  She had a spinal anesthetic followed by insertion of Foley catheter  Preop evaluation revealed 105 degree passive flexion test and the knee came to full extension fixed varus mild less than 10 degrees  After sterile prep and drape and timeout following procedure was completed  Midline incision standard.  Subcutaneous tissue divided down to fascia and extensor mechanism tensor mechanism divided medially with medial arthrotomy.  Patella fat pad resected anterior horns of medial lateral meniscus resected.  ACL resected.  Osteotome used to clear the notch and PCL resected.  Medial soft tissue sleeve elevated to the mid coronal plane  Knee was placed in flexion with the patella everted femoral canal was entered just off the midline above the PCL and the intramedullary contents were evacuated.  We set the femoral guide for 11 mm distal cut 5 degree  left.  This was completed after pinning  Measured resection technique was used and the prominent condyle resected measured 11  We proceeded to measure the femur preliminary and it measured at 5  We then flexed the knee placed the tibial guide and made the proximal tibial cut we aimed for neutral varus valgus cut 3 mm resection from the medial side 3 degree posterior slope  This cut was completed and the medial side indeed measured 3 mm.  Extension gap 7 mm spacer block was inserted and this gave a stable extension gap  We then we measured the femur placed the block for 5 with Whitesides line used to help with external rotation.  We placed the spacer block to check the flexion gap and indeed it was a 7 as well  We proceeded with a 4 femoral cuts and then rechecked the flexion extension gaps again 7 spacer block gave the best extension and flexion with stability  We used the laminar spreaders to check the rectangular shape of the flexion extension gap and with further medial release we were able to get a stable rectangular flexion gap as well  We then cut the distal femoral notch with a size 5 cutting block  We then placed the trial components we used a 6 and a 7 polyethylene trial, the 7 gave the best stability  This was followed by preparation of the tibia and patella the patella measured 21 and half millimeters it was cut down to 12 mm and a 35 x 9.5 patellar button cutting guide was used.  Patella tracking was normal with trial components in place  The joint was  irrigated cement was prepared and then the implants were cemented.  We trialed again with a 6 and a 7 polyethylene insert after all implants were in place and cement had been removed.  We ended up placing the 7 polyethylene insert.  The passive flexion test was 115 degrees the knee was in full extension it was stable the 90 degree flexion test had 5 mm or less anterior drawer  This was checked again with the patella reduced.   This was stable  The wound was irrigated again Zen relief was applied extensor mechanism was closed with #1 Braylon in interrupted fashion followed by 2 layers of 0 Monocryl  Staples were used to reapproximate the skin edges sterile bandage was applied followed by Cryo/Cuff which was activated  Patient was taken to recovery in stable condition  Regular postop plan  SURGEON:  Surgeon(s) and Role:    * Vickki Hearing, MD - Primary  PHYSICIAN ASSISTANT:   ASSISTANTS: nikki cox   ANESTHESIA:   spinal  EBL:  20 mL   BLOOD ADMINISTERED:none  DRAINS: none   LOCAL MEDICATIONS USED:  OTHER zinrelef  SPECIMEN:  No Specimen  DISPOSITION OF SPECIMEN:  N/A  COUNTS:  YES  TOURNIQUET:   Total Tourniquet Time Documented: Thigh (Left) - 104 minutes Total: Thigh (Left) - 104 minutes   DICTATION: .Reubin Milan Dictation  PLAN OF CARE: Admit for overnight observation  PATIENT DISPOSITION:  PACU - hemodynamically stable.   Delay start of Pharmacological VTE agent (>24hrs) due to surgical blood loss or risk of bleeding: yes

## 2022-06-20 NOTE — Transfer of Care (Signed)
Immediate Anesthesia Transfer of Care Note  Patient: KATHY WAHID  Procedure(s) Performed: TOTAL KNEE ARTHROPLASTY (Left: Knee)  Patient Location: PACU  Anesthesia Type:Spinal  Level of Consciousness: awake, alert , oriented and patient cooperative  Airway & Oxygen Therapy: Patient Spontanous Breathing  Post-op Assessment: Report given to RN and Post -op Vital signs reviewed and stable  Post vital signs: Reviewed and stable  Last Vitals:  Vitals Value Taken Time  BP 112/70 06/20/22 1011  Temp 36.9 C 06/20/22 1011  Pulse 114 06/20/22 1011  Resp 20 06/20/22 1014  SpO2 95 % 06/20/22 1011  Vitals shown include unvalidated device data.  Last Pain:  Vitals:   06/20/22 0653  TempSrc: Oral  PainSc: 4       Patients Stated Pain Goal: 6 (06/20/22 9563)  Complications: No notable events documented.

## 2022-06-20 NOTE — Brief Op Note (Signed)
06/20/2022  10:08 AM  PATIENT:  Carol Dunn  56 y.o. female  PRE-OPERATIVE DIAGNOSIS:  Left knee osteoarthritis  POST-OPERATIVE DIAGNOSIS:  Left knee osteoarthritis  PROCEDURE:  Procedure(s): TOTAL KNEE ARTHROPLASTY (Left)  SURGEON:  Surgeon(s) and Role:    Vickki Hearing, MD - Primary  PHYSICIAN ASSISTANT:   ASSISTANTS: nikki cox   ANESTHESIA:   spinal  EBL:  20 mL   BLOOD ADMINISTERED:none  DRAINS: none   LOCAL MEDICATIONS USED:  OTHER zinrelef  SPECIMEN:  No Specimen  DISPOSITION OF SPECIMEN:  N/A  COUNTS:  YES  TOURNIQUET:   Total Tourniquet Time Documented: Thigh (Left) - 104 minutes Total: Thigh (Left) - 104 minutes   DICTATION: .Reubin Milan Dictation  PLAN OF CARE: Admit for overnight observation  PATIENT DISPOSITION:  PACU - hemodynamically stable.   Delay start of Pharmacological VTE agent (>24hrs) due to surgical blood loss or risk of bleeding: yes

## 2022-06-20 NOTE — Anesthesia Postprocedure Evaluation (Signed)
Anesthesia Post Note  Patient: Carol Dunn  Procedure(s) Performed: TOTAL KNEE ARTHROPLASTY (Left: Knee)  Patient location during evaluation: PACU Anesthesia Type: Spinal Level of consciousness: awake and alert and oriented Pain management: pain level controlled Vital Signs Assessment: post-procedure vital signs reviewed and stable Respiratory status: spontaneous breathing, nonlabored ventilation and respiratory function stable Cardiovascular status: blood pressure returned to baseline and stable Postop Assessment: no apparent nausea or vomiting, spinal receding and no headache Anesthetic complications: no   No notable events documented.   Last Vitals:  Vitals:   06/20/22 1115 06/20/22 1130  BP: 130/87 (!) 132/90  Pulse: 94 96  Resp: 17 17  Temp:  36.7 C  SpO2: 99% 97%    Last Pain:  Vitals:   06/20/22 1130  TempSrc:   PainSc: 1                  Dallis Czaja C Corrinne Benegas

## 2022-06-21 ENCOUNTER — Telehealth: Payer: Self-pay | Admitting: Radiology

## 2022-06-21 DIAGNOSIS — M1712 Unilateral primary osteoarthritis, left knee: Secondary | ICD-10-CM | POA: Diagnosis not present

## 2022-06-21 LAB — BASIC METABOLIC PANEL
Anion gap: 4 — ABNORMAL LOW (ref 5–15)
BUN: 16 mg/dL (ref 6–20)
CO2: 24 mmol/L (ref 22–32)
Calcium: 8.5 mg/dL — ABNORMAL LOW (ref 8.9–10.3)
Chloride: 110 mmol/L (ref 98–111)
Creatinine, Ser: 0.66 mg/dL (ref 0.44–1.00)
GFR, Estimated: >60 mL/min (ref >60)
Glucose, Bld: 154 mg/dL — ABNORMAL HIGH (ref 70–99)
Potassium: 3.7 mmol/L (ref 3.5–5.1)
Sodium: 138 mmol/L (ref 135–145)

## 2022-06-21 LAB — CBC
HCT: 31.7 % — ABNORMAL LOW (ref 36.0–46.0)
Hemoglobin: 10.6 g/dL — ABNORMAL LOW (ref 12.0–15.0)
MCH: 31 pg (ref 26.0–34.0)
MCHC: 33.4 g/dL (ref 30.0–36.0)
MCV: 92.7 fL (ref 80.0–100.0)
Platelets: 220 10*3/uL (ref 150–400)
RBC: 3.42 MIL/uL — ABNORMAL LOW (ref 3.87–5.11)
RDW: 13.2 % (ref 11.5–15.5)
WBC: 17.5 10*3/uL — ABNORMAL HIGH (ref 4.0–10.5)
nRBC: 0 % (ref 0.0–0.2)

## 2022-06-21 MED ORDER — METHOCARBAMOL 500 MG PO TABS: 500.0000 mg | ORAL_TABLET | Freq: Four times a day (QID) | ORAL | 0 refills | Status: DC

## 2022-06-21 MED ORDER — POLYETHYLENE GLYCOL 3350 17 G PO PACK: 17.0000 g | PACK | Freq: Every day | ORAL | 0 refills | Status: DC

## 2022-06-21 MED ORDER — HYDROCODONE-ACETAMINOPHEN 10-325 MG PO TABS: 1.0000 | ORAL_TABLET | ORAL | 0 refills | Status: DC | PRN

## 2022-06-21 MED ORDER — ASPIRIN 81 MG PO CHEW: 81.0000 mg | CHEWABLE_TABLET | Freq: Two times a day (BID) | ORAL | 0 refills | Status: DC

## 2022-06-21 MED ORDER — DOCUSATE SODIUM 100 MG PO CAPS: 100.0000 mg | ORAL_CAPSULE | Freq: Two times a day (BID) | ORAL | 0 refills | Status: DC

## 2022-06-21 MED ORDER — TRAMADOL HCL 50 MG PO TABS: 50.0000 mg | ORAL_TABLET | Freq: Four times a day (QID) | ORAL | 0 refills | Status: DC

## 2022-06-21 NOTE — Discharge Summary (Addendum)
Physician Discharge Summary  Patient ID: LORAINA STAUFFER MRN: [id number] DOB/AGE: Jul 08, 1966 56 y.o.  Admit date: 06/20/2022 Discharge date: 06/21/2022  Admission Diagnoses: Osteoarthritis left knee  Discharge Diagnoses:  Principal Problem:   Osteoarthritis of left knee   Discharged Condition: good  Hospital Course: Unremarkable hospital course.  June 20, 2022 left total knee, spinal anesthetic.  Implant [employer] attune fixed-bearing posterior stabilized total knee size 5 narrow femur size 4 tibia size 7 polyethylene PS insert size 35 patella  June 21, 2022 tolerated PT well including steps range of motion 105 degrees gait approximately 200 feet    Consults: None  Significant Diagnostic Studies: labs:     Latest Ref Rng & Units 06/21/2022    5:15 AM 06/16/2022   10:37 AM 07/11/2016   10:50 AM  CBC  WBC 4.0 - 10.5 K/uL 17.5  6.5  26.3   Hemoglobin 12.0 - 15.0 g/dL 97.9  89.2  9.9   Hematocrit 36.0 - 46.0 % 31.7  38.0  29.5   Platelets 150 - 400 K/uL 220  238  231       Latest Ref Rng & Units 06/21/2022    5:15 AM 06/16/2022   10:37 AM 07/11/2016    5:00 AM  BMP  Glucose 70 - 99 mg/dL 119  98  417   BUN 6 - 20 mg/dL 16  16  9    Creatinine 0.44 - 1.00 mg/dL  4.08  1.44   Sodium 135 - 145 mmol/L 138  138  136   Potassium 3.5 - 5.1 mmol/L 3.7  3.9  4.1   Chloride 98 - 111 mmol/L 110  107  107   CO2 22 - 32 mmol/L 24  26  22    Calcium 8.9 - 10.3 mg/dL 8.5  9.2  8.3      Treatments: surgery: Left total knee arthroplasty  Discharge Exam: Blood pressure (!) 136/93, pulse 93, temperature 97.9 F (36.6 C), resp. rate 16, height 5\' 2"  (1.575 m), weight 78 kg, SpO2 100 %. The patient was awake alert and oriented sitting on side of the bed smiling.  Dressing had scant drainage calf is soft supple DVT signs were negative patient was able to bend the knee 90 degrees extended to 0  Disposition: Discharge disposition: 01-Home or Self Care       Discharge  Instructions     Call MD / Call 911   Complete by: As directed    If you experience chest pain or shortness of breath, CALL 911 and be transported to the hospital emergency room.  If you develope a fever above 101 F, pus (white drainage) or increased drainage or redness at the wound, or calf pain, call your surgeon's office.   Constipation Prevention   Complete by: As directed    Drink plenty of fluids.  Prune juice may be helpful.  You may use a stool softener, such as Colace (over the counter) 100 mg twice a day.  Use MiraLax (over the counter) for constipation as needed.   Diet - low sodium heart healthy   Complete by: As directed    Discharge instructions   Complete by: As directed    Do you exercise as instructed by physical therapy  Weight-bear as tolerated with your walker  You can take a shower as long as the dressing is on  Bone foam 3 times a day for 30 minutes  If you have a CPM machine at your home  you can use that 6 hours a day started 0 to 90 degrees increase 10 degrees every other day until you reach 120 degrees after 2 weeks she can return the machine by calling the company to pick it up  Aspirin twice a day for 30 days  White stockings for 30 days   Increase activity slowly as tolerated   Complete by: As directed    Post-operative opioid taper instructions:   Complete by: As directed    POST-OPERATIVE OPIOID TAPER INSTRUCTIONS: It is important to wean off of your opioid medication as soon as possible. If you do not need pain medication after your surgery it is ok to stop day one. Opioids include: Codeine, Hydrocodone(Norco, Vicodin), Oxycodone(Percocet, oxycontin) and hydromorphone amongst others.  Long term and even short term use of opiods can cause: Increased pain response Dependence Constipation Depression Respiratory depression And more.  Withdrawal symptoms can include Flu like symptoms Nausea, vomiting And more Techniques to manage these  symptoms Hydrate well Eat regular healthy meals Stay active Use relaxation techniques(deep breathing, meditating, yoga) Do Not substitute Alcohol to help with tapering If you have been on opioids for less than two weeks and do not have pain than it is ok to stop all together.  Plan to wean off of opioids This plan should start within one week post op of your joint replacement. Maintain the same interval or time between taking each dose and first decrease the dose.  Cut the total daily intake of opioids by one tablet each day Next start to increase the time between doses. The last dose that should be eliminated is the evening dose.         Allergies as of 06/21/2022   No Known Allergies      Medication List     STOP taking these medications    meloxicam 15 MG tablet Commonly known as: MOBIC       TAKE these medications    acetaminophen 650 MG CR tablet Commonly known as: TYLENOL Take 650-1,300 mg by mouth every 8 (eight) hours as needed for pain.   amoxicillin 500 MG capsule Commonly known as: AMOXIL Take 2,000 mg by mouth as directed. Take 4 capsules (2000 mg) by mouth 1 hour prior to dental appointments.   aspirin 81 MG chewable tablet Chew 1 tablet (81 mg total) by mouth 2 (two) times daily.   atorvastatin 20 MG tablet Commonly known as: LIPITOR Take 20 mg by mouth in the morning.   C 500 500 MG tablet Generic drug: ascorbic acid Take 500 mg by mouth in the morning.   docusate sodium 100 MG capsule Commonly known as: COLACE Take 1 capsule (100 mg total) by mouth 2 (two) times daily.   Garlic 1000 MG Caps Take 1,000 mg by mouth 3 (three) times a week.   HYDROcodone-acetaminophen 10-325 MG tablet Commonly known as: Norco Take 1 tablet by mouth every 4 (four) hours as needed.   lisinopril-hydrochlorothiazide 20-12.5 MG tablet Commonly known as: ZESTORETIC Take 1 tablet by mouth in the morning.   meclizine 25 MG tablet Commonly known as:  ANTIVERT Take 25 mg by mouth 3 (three) times daily as needed for dizziness.   methocarbamol 500 MG tablet Commonly known as: ROBAXIN Take 1 tablet (500 mg total) by mouth 4 (four) times daily.   multivitamin with minerals Tabs tablet Take 1 tablet by mouth daily. Women's Multivitamin 50+   polyethylene glycol 17 g packet Commonly known as: MIRALAX / GLYCOLAX Take 17  g by mouth daily. Start taking on: June 22, 2022   QC Vitamin D3 125 MCG (5000 UT) Tabs Generic drug: Cholecalciferol Take 5,000 Units by mouth in the morning.   traMADol 50 MG tablet Commonly known as: ULTRAM Take 1 tablet (50 mg total) by mouth every 6 (six) hours.   valACYclovir 500 MG tablet Commonly known as: VALTREX Take 500 mg by mouth 2 (two) times daily as needed (outbreak).         Signed: Fuller Canada 06/21/2022, 12:41 PM

## 2022-06-21 NOTE — Telephone Encounter (Signed)
Completed a prior authorization request for patients hydrocodone on cover my meds

## 2022-06-21 NOTE — Progress Notes (Signed)
Physical Therapy Treatment Patient Details Name: Carol Dunn MRN: [id number] DOB: [birth year] Today's Date: 06/21/2022   LEFT KNEE ROM: 0 - 110 degrees AMBULATION DISTANCE: 200 feet using RW with Mod independence   History of Present Illness Carol Dunn a 56 y/o female, s/p Left TKA on 06/20/22 with the diagnosis of Left knee osteoarthritis.    PT Comments    Patient demonstrates good return for going up/down steps in stairwell using bilateral side rails with step to pattern without loss of balance and understanding acknowledged, increased endurance/distance for ambulation and tolerated up to 0 - 110 degrees knee.  Patient will benefit from continued skilled physical therapy in hospital and recommended venue below to increase strength, balance, endurance for safe ADLs and gait.    Recommendations for follow up therapy are one component of a multi-disciplinary discharge planning process, led by the attending physician.  Recommendations may be updated based on patient status, additional functional criteria and insurance authorization.  Follow Up Recommendations  Home health PT     Assistance Recommended at Discharge Set up Supervision/Assistance  Patient can return home with the following A little help with walking and/or transfers;A little help with bathing/dressing/bathroom;Help with stairs or ramp for entrance;Assistance with cooking/housework   Equipment Recommendations  None recommended by PT    Recommendations for Other Services       Precautions / Restrictions Precautions Precautions: Fall Restrictions Weight Bearing Restrictions: Yes LLE Weight Bearing: Weight bearing as tolerated     Mobility  Bed Mobility Overal bed mobility: Independent                  Transfers Overall transfer level: Modified independent                 General transfer comment: good return for sit to stands and transfers using RW    Ambulation/Gait Ambulation/Gait  assistance: Modified independent (Device/Increase time) Gait Distance (Feet): 200 Feet Assistive device: Rolling walker (2 wheels) Gait Pattern/deviations: Decreased step length - right, Decreased step length - left, Decreased stride length Gait velocity: slightly decreased     General Gait Details: demonstrates increased endurance/distance for ambulation with good return for left heel to toe stepping, pain at baseline and no loss of balance   Stairs Stairs: Yes Stairs assistance: Modified independent (Device/Increase time) Stair Management: Two rails, Step to pattern Number of Stairs: 5 General stair comments: patient demonstrates good return for going up/down steps with step to pattern and use of bilateral side rails without loss balance and understanding acknowledged   Wheelchair Mobility    Modified Rankin (Stroke Patients Only)       Balance Overall balance assessment: Needs assistance Sitting-balance support: Feet supported, No upper extremity supported Sitting balance-Leahy Scale: Good Sitting balance - Comments: seated at EOB   Standing balance support: During functional activity, Bilateral upper extremity supported Standing balance-Leahy Scale: Good Standing balance comment: using RW                            Cognition Arousal/Alertness: Awake/alert Behavior During Therapy: WFL for tasks assessed/performed Overall Cognitive Status: Within Functional Limits for tasks assessed                                          Exercises Total Joint Exercises Ankle Circles/Pumps: Supine, 10 reps, Left, Strengthening, AROM Quad  Sets: AROM, Strengthening, Left, 10 reps, Supine Short Arc Quad: AROM, Strengthening, Left, 10 reps, Supine Heel Slides: Supine, 10 reps, Left, Strengthening, AROM    General Comments        Pertinent Vitals/Pain Pain Assessment Pain Assessment: No/denies pain Pain Score: 6  Pain Location: left knee Pain  Descriptors / Indicators: Sore Pain Intervention(s): Limited activity within patient's tolerance, Monitored during session, Premedicated before session, Repositioned    Home Living                          Prior Function            PT Goals (current goals can now be found in the care plan section) Acute Rehab PT Goals Patient Stated Goal: return home with family to assist PT Goal Formulation: With patient/family Time For Goal Achievement: 06/22/22 Potential to Achieve Goals: Good Progress towards PT goals: Progressing toward goals    Frequency    BID      PT Plan Current plan remains appropriate    Co-evaluation              AM-PAC PT "6 Clicks" Mobility   Outcome Measure  Help needed turning from your back to your side while in a flat bed without using bedrails?: None Help needed moving from lying on your back to sitting on the side of a flat bed without using bedrails?: None Help needed moving to and from a bed to a chair (including a wheelchair)?: None Help needed standing up from a chair using your arms (e.g., wheelchair or bedside chair)?: None Help needed to walk in hospital room?: A Little Help needed climbing 3-5 steps with a railing? : A Little 6 Click Score: 22    End of Session   Activity Tolerance: Patient tolerated treatment well Patient left: in bed;with call bell/phone within reach;with family/visitor present Nurse Communication: Mobility status PT Visit Diagnosis: Unsteadiness on feet (R26.81);Other abnormalities of gait and mobility (R26.89);Muscle weakness (generalized) (M62.81)     Time: 2025-4270 PT Time Calculation (min) (ACUTE ONLY): 20 min  Charges:  $Gait Training: 8-22 mins $Therapeutic Exercise: 8-22 mins                     9:18 AM, 06/21/22 Ocie Bob, MPT Physical Therapist with [facility] 336 [phone] office [phone] mobile phone

## 2022-06-21 NOTE — Progress Notes (Signed)
Subjective: 1 Day Post-Op Procedure(s) (LRB): TOTAL KNEE ARTHROPLASTY (Left) Patient reports pain as mild.    Objective: Vital signs in last 24 hours: Temp:  [97.7 F (36.5 C)-97.9 F (36.6 C)] 97.9 F (36.6 C) (08/09 0405) Pulse Rate:  [75-95] 93 (08/09 0405) Resp:  [16-20] 16 (08/09 0405) BP: (133-154)/(84-97) 136/93 (08/09 0405) SpO2:  [99 %-100 %] 100 % (08/09 0405)  Intake/Output from previous day: 08/08 0701 - 08/09 0700 In: 1714.1 [P.O.:720; I.V.:894.1; IV Piggyback:100] Out: 520 [Urine:500; Blood:20] Intake/Output this shift: Total I/O In: 720 [P.O.:720] Out: 600 [Urine:600]  Recent Labs    06/21/22 0515  HGB 10.6*   Recent Labs    06/21/22 0515  WBC 17.5*  RBC 3.42*  HCT 31.7*  PLT 220   Recent Labs    06/21/22 0515  NA 138  K 3.7  CL 110  CO2 24  BUN 16  CREATININE 0.66  GLUCOSE 154*  CALCIUM 8.5*   No results for input(s): "LABPT", "INR" in the last 72 hours.  Neurologically intact Neurovascular intact Sensation intact distally Intact pulses distally Dorsiflexion/Plantar flexion intact Incision: scant drainage No cellulitis present Compartment soft   Assessment/Plan: 1 Day Post-Op Procedure(s) (LRB): TOTAL KNEE ARTHROPLASTY (Left) Advance diet Up with therapy D/C IV fluids Discharge home with home health     Fuller Canada 06/21/2022, 12:35 PM

## 2022-06-22 LAB — TYPE AND SCREEN
ABO/RH(D): B POS
Antibody Screen: NEGATIVE
Unit division: 0
Unit division: 0

## 2022-06-22 LAB — BPAM RBC
Blood Product Expiration Date: 202308162359
Blood Product Expiration Date: 202309072359
Unit Type and Rh: 1700
Unit Type and Rh: 1700

## 2022-06-23 ENCOUNTER — Encounter (HOSPITAL_COMMUNITY): Payer: Self-pay | Admitting: Orthopedic Surgery

## 2022-06-29 ENCOUNTER — Telehealth: Payer: Self-pay | Admitting: Orthopedic Surgery

## 2022-06-29 NOTE — Telephone Encounter (Signed)
Call from physical therapist Apolinar Junes, while w/patient - states her hear rate is up - wanted to let us know. His number is [phone] - said will be with her 15 more minutes as of 4:02pm

## 2022-06-30 NOTE — Telephone Encounter (Signed)
Yes, routing.

## 2022-06-30 NOTE — Telephone Encounter (Signed)
Spoke with physical therapist, Apolinar Junes. He stated that patients HR was 110-120 while he was there yesterday. Patient had been sitting 45-60 mins before pulse was taken. He said he will see her again this afternoon and will check it and call the office if abnormal.  Spoke to patient. She states she could not feel that her HR was up. She felt fine other than the pain. The pain med prescribed post surgery isn't controlling the pain so she is unable to do what she needs to during her PT session. Advised patient I'd send a note to provider.

## 2022-06-30 NOTE — Telephone Encounter (Signed)
Can you have leta or abby follow up on this

## 2022-07-03 DIAGNOSIS — Z96652 Presence of left artificial knee joint: Secondary | ICD-10-CM | POA: Insufficient documentation

## 2022-07-05 ENCOUNTER — Encounter: Payer: Self-pay | Admitting: Orthopedic Surgery

## 2022-07-05 ENCOUNTER — Ambulatory Visit (INDEPENDENT_AMBULATORY_CARE_PROVIDER_SITE_OTHER): Payer: BC Managed Care – PPO | Admitting: Orthopedic Surgery

## 2022-07-05 DIAGNOSIS — Z96652 Presence of left artificial knee joint: Secondary | ICD-10-CM

## 2022-07-05 MED ORDER — OXYCODONE-ACETAMINOPHEN 5-325 MG PO TABS: 1.0000 | ORAL_TABLET | ORAL | 0 refills | Status: AC | PRN

## 2022-07-05 NOTE — Progress Notes (Signed)
Postop visit #1 status post left total knee  The patient has finished home physical therapy as of the end of this week.  She is doing well.  She is having some pain which is not controlled by hydrocodone.  We will switch her over to Percocet  She is walking with her cane her wound looks good the staples were removed we placed some Steri-Strips over a couple of areas superior to the patella that looks like the staples may have allow the wound to separate  She will keep the dressing on for 3 days then use Steri-Strips for up to 7 days and then she can shower  Physical therapy follow-up in 3 weeks change medication as follows  Meds ordered this encounter  Medications   oxyCODONE-acetaminophen (PERCOCET) 5-325 MG tablet    Sig: Take 1 tablet by mouth every 4 (four) hours as needed for up to 7 days for severe pain.    Dispense:  42 tablet    Refill:  0

## 2022-07-26 ENCOUNTER — Encounter: Payer: Self-pay | Admitting: Orthopedic Surgery

## 2022-07-26 ENCOUNTER — Ambulatory Visit (INDEPENDENT_AMBULATORY_CARE_PROVIDER_SITE_OTHER): Payer: BC Managed Care – PPO | Admitting: Orthopedic Surgery

## 2022-07-26 DIAGNOSIS — Z96652 Presence of left artificial knee joint: Secondary | ICD-10-CM

## 2022-07-26 NOTE — Progress Notes (Signed)
Chief Complaint  Patient presents with   Post-op Follow-up    Left- DOS 8/85/23 still stiff continuing therapy   5 weeks postop left total knee doing well.  Pain is now down to about a 4  Complains of some stiffness after sitting for long periods of time therapy is progressing well  Patient ambulatory with a cane  Patient's opioids can be tapered down  Follow-up in 6 weeks  Patient will continue to be out of work

## 2022-07-26 NOTE — Patient Instructions (Signed)
Try to use tylenol for pain 500 mg every 6hrs   Try to use the opioids only as needed and try to stop by next office visit

## 2022-07-28 ENCOUNTER — Other Ambulatory Visit: Payer: Self-pay | Admitting: Family

## 2022-07-28 DIAGNOSIS — Z1231 Encounter for screening mammogram for malignant neoplasm of breast: Secondary | ICD-10-CM

## 2022-08-23 ENCOUNTER — Ambulatory Visit
Admission: RE | Admit: 2022-08-23 | Discharge: 2022-08-23 | Disposition: A | Discharge status: Home / Self Care | Payer: BC Managed Care – PPO | Source: Ambulatory Visit | Attending: Family | Admitting: Family

## 2022-08-23 DIAGNOSIS — Z1231 Encounter for screening mammogram for malignant neoplasm of breast: Secondary | ICD-10-CM | POA: Insufficient documentation

## 2022-09-04 ENCOUNTER — Encounter: Payer: Self-pay | Admitting: Orthopedic Surgery

## 2022-09-04 ENCOUNTER — Ambulatory Visit (INDEPENDENT_AMBULATORY_CARE_PROVIDER_SITE_OTHER): Payer: BC Managed Care – PPO | Admitting: Orthopedic Surgery

## 2022-09-04 DIAGNOSIS — Z96652 Presence of left artificial knee joint: Secondary | ICD-10-CM

## 2022-09-04 DIAGNOSIS — M62838 Other muscle spasm: Secondary | ICD-10-CM

## 2022-09-04 MED ORDER — TIZANIDINE HCL 4 MG PO TABS: 4.0000 mg | ORAL_TABLET | Freq: Every day | ORAL | 1 refills | Status: DC

## 2022-09-04 NOTE — Progress Notes (Signed)
FOLLOW UP   Encounter Diagnosis  Name Primary?   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 Yes     Chief Complaint  Patient presents with   Post-op Follow-up    Left - TKA DOS 06/20/22- feels stiff and has like cramps and spasms mainly at night    Carol Dunn is 2-1/2 months post left total knee arthroplasty  Her overall pain is well controlled she does have some night spasms and she feels tight in the front of the knee  She does have some scarring around the patella which would require some patellar mobilization and she needs to continue to work on flexion  She has reached full extension her flexion is 108 degrees  Meds ordered this encounter  Medications   tiZANidine (ZANAFLEX) 4 MG tablet    Sig: Take 1 tablet (4 mg total) by mouth daily.    Dispense:  30 tablet    Refill:  1    Encounter Diagnoses  Name Primary?   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

## 2022-09-04 NOTE — Patient Instructions (Signed)
RETURN TO WORK LIGHT DUTY NEXT MONDAY FOR 6 WEEKS

## 2022-09-18 ENCOUNTER — Telehealth: Payer: Self-pay | Admitting: Radiology

## 2022-09-18 NOTE — Telephone Encounter (Signed)
Provided work note paitent was in office with husband

## 2022-10-26 ENCOUNTER — Other Ambulatory Visit: Payer: Self-pay | Admitting: Orthopedic Surgery

## 2022-10-26 DIAGNOSIS — M62838 Other muscle spasm: Secondary | ICD-10-CM

## 2022-10-26 DIAGNOSIS — Z96652 Presence of left artificial knee joint: Secondary | ICD-10-CM

## 2022-10-30 ENCOUNTER — Telehealth: Payer: Self-pay | Admitting: Orthopedic Surgery

## 2022-10-30 NOTE — Telephone Encounter (Signed)
Patient states that she had surgery 06/20/22 and she wants to know if it is normal for her to be swelling from her knee all the way down to her foot.  Pain Level is a 6   Please call her back at [phone]

## 2022-11-03 ENCOUNTER — Ambulatory Visit (INDEPENDENT_AMBULATORY_CARE_PROVIDER_SITE_OTHER): Payer: BC Managed Care – PPO

## 2022-11-03 ENCOUNTER — Encounter: Payer: Self-pay | Admitting: Orthopedic Surgery

## 2022-11-03 ENCOUNTER — Ambulatory Visit: Payer: BC Managed Care – PPO | Admitting: Orthopedic Surgery

## 2022-11-03 DIAGNOSIS — Z96652 Presence of left artificial knee joint: Secondary | ICD-10-CM

## 2022-11-03 DIAGNOSIS — M25562 Pain in left knee: Secondary | ICD-10-CM

## 2022-11-03 DIAGNOSIS — M76892 Other specified enthesopathies of left lower limb, excluding foot: Secondary | ICD-10-CM

## 2022-11-03 DIAGNOSIS — G8929 Other chronic pain: Secondary | ICD-10-CM

## 2022-11-03 NOTE — Progress Notes (Signed)
Chief Complaint  Patient presents with   Knee Pain    Left knee pain and swelling, mostly end of day.  Has an "aggravated" spot ant/med knee still.  L TKA 06/20/22    56 year old female pain and swelling left knee and left leg for months after left total knee with attune fixed-bearing posterior stabilized knee which with very well  The patient says that the swelling goes down by the next day but has pain and swelling at the end of the day.  The pain is over the medial side of the knee over the soft tissue near the pes bursa and tendon  The exam was unremarkable she had good extension good control excellent flexion I would estimate that to be at 115 degrees the knee felt stable  The x-rays show no loosening or complications  Postop pain medial side left knee after total knee most likely tendinitis bursitis  She had been using some Voltaren and Biofreeze I told her to use Aspercreme  Elevate the leg at the end of the day and as long as the swelling goes down by the next morning she should be fine  She has an appointment in January for the 69-month checkup and we will see her at that time

## 2022-12-07 ENCOUNTER — Ambulatory Visit: Payer: BC Managed Care – PPO | Admitting: Orthopedic Surgery

## 2022-12-11 ENCOUNTER — Encounter: Payer: Self-pay | Admitting: Orthopedic Surgery

## 2022-12-11 ENCOUNTER — Ambulatory Visit: Payer: BC Managed Care – PPO | Admitting: Orthopedic Surgery

## 2022-12-11 DIAGNOSIS — M1710 Unilateral primary osteoarthritis, unspecified knee: Secondary | ICD-10-CM

## 2022-12-11 DIAGNOSIS — M7551 Bursitis of right shoulder: Secondary | ICD-10-CM | POA: Diagnosis not present

## 2022-12-11 DIAGNOSIS — M7052 Other bursitis of knee, left knee: Secondary | ICD-10-CM | POA: Diagnosis not present

## 2022-12-11 DIAGNOSIS — Z96652 Presence of left artificial knee joint: Secondary | ICD-10-CM

## 2022-12-11 MED ORDER — METHYLPREDNISOLONE ACETATE 40 MG/ML IJ SUSP
40.0000 mg | Freq: Once | INTRAMUSCULAR | Status: AC
  Administered 2022-12-11: 40 mg via INTRA_ARTICULAR

## 2022-12-11 NOTE — Progress Notes (Signed)
Chief Complaint  Patient presents with   Follow-up    Recheck on left knee, DOS 06/20/22.   Left total knee on 06/20/2022 continues to have some Pez bursal pain difficulty descending stairs some tightness when she bends the knee  She has full extension the knee flexes to 115 degrees feels stable she has some tenderness over the pes bursa  Right shoulder pain  History of bursitis treated with injection injection lasted about 2 months complains of right shoulder pain and throbbing no history of trauma  Exam is consistent with normal strength in the cuff some pain with abduction resistance full range of motion positive Hawkins sign for impingement  Impression bursitis right shoulder     Procedure note the subacromial injection shoulder RIGHT    Verbal consent was obtained to inject the  RIGHT   Shoulder  Timeout was completed to confirm the injection site is a subacromial space of the  RIGHT  shoulder   Medication used Depo-Medrol 40 mg and lidocaine 1% 3 cc  Anesthesia was provided by ethyl chloride  The injection was performed in the RIGHT  posterior subacromial space. After pinning the skin with alcohol and anesthetized the skin with ethyl chloride the subacromial space was injected using a 20-gauge needle. There were no complications  Sterile dressing was applied.    Procedure note Left knee injection for bursitis   verbal consent was obtained to inject Left knee PES BURSA  Timeout was completed to confirm the site of injection  The medications used were 40 mg of Depo-Medrol and 1% lidocaine 3 cc  Anesthesia was provided by ethyl chloride and the skin was prepped with alcohol.  After cleaning the skin with alcohol a 25-gauge needle was used to inject the left knee bursa   There were no complications and a sterile bandage was applied

## 2022-12-11 NOTE — Addendum Note (Signed)
Addended by: Obie Dredge A on: 12/11/2022 05:01 PM   Modules accepted: Orders

## 2023-01-06 ENCOUNTER — Other Ambulatory Visit: Payer: Self-pay | Admitting: Orthopedic Surgery

## 2023-01-06 DIAGNOSIS — M62838 Other muscle spasm: Secondary | ICD-10-CM

## 2023-01-06 DIAGNOSIS — Z96652 Presence of left artificial knee joint: Secondary | ICD-10-CM

## 2023-01-22 ENCOUNTER — Encounter: Payer: Self-pay | Admitting: Orthopedic Surgery

## 2023-01-22 ENCOUNTER — Ambulatory Visit: Payer: BC Managed Care – PPO | Admitting: Orthopedic Surgery

## 2023-01-22 DIAGNOSIS — M25862 Other specified joint disorders, left knee: Secondary | ICD-10-CM | POA: Diagnosis not present

## 2023-01-22 DIAGNOSIS — Z96652 Presence of left artificial knee joint: Secondary | ICD-10-CM | POA: Diagnosis not present

## 2023-01-22 NOTE — Progress Notes (Signed)
Chief Complaint  Patient presents with   Shoulder Pain    Right shoulder Feels better after injection   Post-op Follow-up    Left knee replaced 06/20/22; still having knee pain    Carol Dunn has anteromedial knee pain  She has had an injection for pes bursitis that has resolved  Her shoulder pain has resolved as well  She says the knee pain is bothering her enough that she would be interested in scar tissue excision arthroscopically  It appears that she is developing scar tissue behind the patella at the junction of the quadriceps tendon where she is most tender.  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 Alternative treatment has also been reviewed, questions were encouraged and answered. The postoperative plan is also been reviewed.

## 2023-01-22 NOTE — Patient Instructions (Signed)
Your surgery will be at [facility] by Dr Harrison  The hospital will contact you with a preoperative appointment to discuss Anesthesia.  Please arrive on time or 15 minutes early for the preoperative appointment, they have a very tight schedule if you are late or do not come in your surgery will be cancelled.  The phone number is [phone]. Please bring your medications with you for the appointment. They will tell you the arrival time and medication instructions when you have your preoperative evaluation. Do not wear nail polish the day of your surgery and if you take Phentermine you need to stop this medication ONE WEEK prior to your surgery. If you take Invokana, Farxiga, Jardiance, or Steglatro) - Hold 72 hours before the procedure.  If you take Ozempic,  Bydureon or Trulicity do not take for 8 days before your surgery. If you take Victoza, Rybelsis, Saxenda or Adlyxi stop 24 hours before the procedure.  Please arrive at the hospital 2 hours before procedure if scheduled at 9:30 or later in the day or at the time the nurse tells you at your preoperative visit.   If you have my chart do not use the time given in my chart use the time given to you by the nurse during your preoperative visit.   Your surgery  time may change. Please be available for phone calls the day of your surgery and the day before. The Short Stay department may need to discuss changes about your surgery time. Not reaching the you could lead to procedure delays and possible cancellation.  You must have a ride home and someone to stay with you for 24 to 48 hours. The person taking you home will receive and sign for the your discharge instructions.  Please be prepared to give your support person's name and telephone number to [facility]. Dr Harrison will need that name and phone number post procedure.   

## 2023-01-29 ENCOUNTER — Telehealth: Payer: Self-pay | Admitting: Radiology

## 2023-01-29 NOTE — Telephone Encounter (Signed)
-----   Message from Josue Hector sent at 01/29/2023  2:15 PM EDT ----- I have not been able to reach this patient to schedule PAT.  Her MB is full.  Can y'all try?  I can give you a PAT or she can call me.  Thanks,  Hoyle Sauer

## 2023-01-29 NOTE — Telephone Encounter (Signed)
Called patient to ask her to call Hoyle Sauer for pre op appointment Her voicemail is full  If she calls back she should call [phone]

## 2023-02-05 ENCOUNTER — Other Ambulatory Visit: Payer: Self-pay | Admitting: Orthopedic Surgery

## 2023-02-05 DIAGNOSIS — Z96652 Presence of left artificial knee joint: Secondary | ICD-10-CM

## 2023-02-05 DIAGNOSIS — M25862 Other specified joint disorders, left knee: Secondary | ICD-10-CM

## 2023-02-05 NOTE — Patient Instructions (Signed)
Carol Dunn  02/05/2023     @PREFPERIOPPHARMACY @   Your procedure is scheduled on  02/13/2023.   Report to [facility] at  [place].   Call this number if you have problems the morning of surgery:  [phone]  If you experience any cold or flu symptoms such as cough, fever, chills, shortness of breath, etc. between now and your scheduled surgery, please notify us at the above number.   Remember:  Do not eat after midnight.    You may drink clear liquids until  0625 am on 02/13/2023.    Clear liquids allowed are:                    Water, Juice (No red color; non-citric and without pulp; diabetics please choose diet or no sugar options), Carbonated beverages (diabetics please choose diet or no sugar options), Clear Tea (No creamer, milk, or cream, including half & half and powdered creamer), Black Coffee Only (No creamer, milk or cream, including half & half and powdered creamer), Plain Jell-O Only (No red color; diabetics please choose no sugar options), Clear Sports drink (No red color; diabetics please choose diet or no sugar options), and Plain Popsicles Only (No red color; diabetics please choose no sugar options)       At 0625 am on 02/13/2023 drink your carb drink. You can have nothing else to drink after this.    Take these medicines the morning of surgery with A SIP OF WATER                                  antivert(if need).     Do not wear jewelry, make-up or nail polish.  Do not wear lotions, powders, or perfumes, or deodorant.  Do not shave 48 hours prior to surgery.  Men may shave face and neck.  Do not bring valuables to the hospital.  [facility] is not responsible for any belongings or valuables.  Contacts, dentures or bridgework may not be worn into surgery.  Leave your suitcase in the car.  After surgery it may be brought to your room.  For patients admitted to the hospital, discharge time will be determined by your treatment team.  Patients  discharged the day of surgery will not be allowed to drive home and must have someone with them for 24 hours.    Special instructions:   DO NOT smoke tobacco or vape for 24 hours before your procedure.  Please read over the following fact sheets that you were given. Pain Booklet, Coughing and Deep Breathing, Surgical Site Infection Prevention, Anesthesia Post-op Instructions, and Care and Recovery After Surgery      Arthroscopic Knee Ligament Repair, Care After This sheet gives you information about how to care for yourself after your procedure. Your health care provider may also give you more specific instructions. If you have problems or questions, contact your health care provider. What can I expect after the procedure? After the procedure, it is common to have: Soreness or pain in your knee. Bruising and swelling on your knee, calf, and ankle for 3-4 days. A small amount of fluid coming from the incisions. Follow these instructions at home: Medicines Take over-the-counter and prescription medicines only as told by your health care provider. Ask your health care provider if the medicine prescribed to you: Requires you to avoid  driving or using machinery. Can cause constipation. You may need to take these actions to prevent or treat constipation: Drink enough fluid to keep your urine pale yellow. Take over-the-counter or prescription medicines. Eat foods that are high in fiber, such as beans, whole grains, and fresh fruits and vegetables. Limit foods that are high in fat and processed sugars, such as fried or sweet foods. If you have a brace or immobilizer: Wear it as told by your health care provider. Remove it only as told by your health care provider. Loosen it if your toes tingle, become numb, or turn cold and blue. Keep it clean and dry. Ask your health care provider when it is safe to drive. Bathing Do not take baths, swim, or use a hot tub until your health care provider  approves. Keep your bandage (dressing) dry until your health care provider says that it can be removed. If the brace or immobilizer is not waterproof: Do not let it get wet. Cover it with a watertight covering when you take a bath or shower. Incision care  Follow instructions from your health care provider about how to take care of your incisions. Make sure you: Wash your hands with soap and water for at least 20 seconds before and after you change your dressing. If soap and water are not available, use hand sanitizer. Change your dressing as told by your health care provider. Leave stitches (sutures), skin glue, or adhesive strips in place. These skin closures may need to stay in place for 2 weeks or longer. If adhesive strip edges start to loosen and curl up, you may trim the loose edges. Do not remove adhesive strips completely unless your health care provider tells you to do that. Check your incision areas every day for signs of infection. Check for: Redness. More swelling or pain. Blood or more fluid. Warmth. Pus or a bad smell. Managing pain, stiffness, and swelling  If directed, put ice on the affected area. To do this: If you have a removable brace or immobilizer, remove it as told by your health care provider. Put ice in a plastic bag. Place a towel between your skin and the bag. Leave the ice on for 20 minutes, 2-3 times a day. Remove the ice if your skin turns bright red. This is very important. If you cannot feel pain, heat, or cold, you have a greater risk of damage to the area. Move your toes often to reduce stiffness and swelling. Raise (elevate) the injured area above the level of your heart while you are sitting or lying down. Activity Do not use your knee to support your body weight until your health care provider says that you can. Use crutches or other devices as told by your health care provider. Do physical therapy exercises as told by your health care provider.  Physical therapy will help you regain movement and strength in your knee. Follow instructions from your health care provider about: When you may start motion exercises. When you may start riding a stationary bike and doing other low-impact activities. When you may start to jog and do other high-impact activities. Do not lift anything that is heavier than 10 lb (4.5 kg), or the limit that you are told, until your health care provider says that it is safe. Ask your health care provider what activities are safe for you. General instructions Do not use any products that contain nicotine or tobacco, such as cigarettes, e-cigarettes, and chewing tobacco. These can delay healing.  If you need help quitting, ask your health care provider. Wear compression stockings as told by your health care provider. These stockings help to prevent blood clots and reduce swelling in your legs. Keep all follow-up visits. This is important. Contact a health care provider if: You have any of these signs of infection: Redness around an incision. Blood or more fluid coming from an incision. Warmth coming from an incision. Pus or a bad smell coming from an incision. More swelling or pain in your knee. A fever or chills. You have pain that does not get better with medicine. Your incision opens up. Get help right away if: You have trouble breathing. You have chest pain. You have increased pain or swelling in your calf or at the back of your knee. You have numbness and tingling near the knee joint or in the foot, ankle, or toes. You notice that your foot or toes look darker than normal or are cooler than normal. These symptoms may represent a serious problem that is an emergency. Do not wait to see if the symptoms will go away. Get medical help right away. Call your local emergency services (911 in the [place] Do not drive yourself to the hospital. Summary After the procedure, it is common to have knee pain with bruising  and swelling on your knee, calf, and ankle. Icing your knee and raising your leg above the level of your heart will help control the pain and swelling. Do physical therapy exercises as told by your health care provider. Physical therapy will help you regain movement and strength in your knee. This information is not intended to replace advice given to you by your health care provider. Make sure you discuss any questions you have with your health care provider. Document Revised: 03/29/2020 Document Reviewed: 03/29/2020 Elsevier Patient Education  [place] Anesthesia, Adult, Care After The following information offers guidance on how to care for yourself after your procedure. Your health care provider may also give you more specific instructions. If you have problems or questions, contact your health care provider. What can I expect after the procedure? After the procedure, it is common for people to: Have pain or discomfort at the IV site. Have nausea or vomiting. Have a sore throat or hoarseness. Have trouble concentrating. Feel cold or chills. Feel weak, sleepy, or tired (fatigue). Have soreness and body aches. These can affect parts of the body that were not involved in surgery. Follow these instructions at home: For the time period you were told by your health care provider:  Rest. Do not participate in activities where you could fall or become injured. Do not drive or use machinery. Do not drink alcohol. Do not take sleeping pills or medicines that cause drowsiness. Do not make important decisions or sign legal documents. Do not take care of children on your own. General instructions Drink enough fluid to keep your urine pale yellow. If you have sleep apnea, surgery and certain medicines can increase your risk for breathing problems. Follow instructions from your health care provider about wearing your sleep device: Anytime you are sleeping, including during  daytime naps. While taking prescription pain medicines, sleeping medicines, or medicines that make you drowsy. Return to your normal activities as told by your health care provider. Ask your health care provider what activities are safe for you. Take over-the-counter and prescription medicines only as told by your health care provider. Do not use any products that contain nicotine or tobacco. These products  include cigarettes, chewing tobacco, and vaping devices, such as e-cigarettes. These can delay incision healing after surgery. If you need help quitting, ask your health care provider. Contact a health care provider if: You have nausea or vomiting that does not get better with medicine. You vomit every time you eat or drink. You have pain that does not get better with medicine. You cannot urinate or have bloody urine. You develop a skin rash. You have a fever. Get help right away if: You have trouble breathing. You have chest pain. You vomit blood. These symptoms may be an emergency. Get help right away. Call 911. Do not wait to see if the symptoms will go away. Do not drive yourself to the hospital. Summary After the procedure, it is common to have a sore throat, hoarseness, nausea, vomiting, or to feel weak, sleepy, or fatigue. For the time period you were told by your health care provider, do not drive or use machinery. Get help right away if you have difficulty breathing, have chest pain, or vomit blood. These symptoms may be an emergency. This information is not intended to replace advice given to you by your health care provider. Make sure you discuss any questions you have with your health care provider. Document Revised: 01/27/2022 Document Reviewed: 01/27/2022 Elsevier Patient Education  [place]. How to Use Chlorhexidine Before Surgery Chlorhexidine gluconate (CHG) is a germ-killing (antiseptic) solution that is used to clean the skin. It can get rid of the bacteria  that normally live on the skin and can keep them away for about 24 hours. To clean your skin with CHG, you may be given: A CHG solution to use in the shower or as part of a sponge bath. A prepackaged cloth that contains CHG. Cleaning your skin with CHG may help lower the risk for infection: While you are staying in the intensive care unit of the hospital. If you have a vascular access, such as a central line, to provide short-term or long-term access to your veins. If you have a catheter to drain urine from your bladder. If you are on a ventilator. A ventilator is a machine that helps you breathe by moving air in and out of your lungs. After surgery. What are the risks? Risks of using CHG include: A skin reaction. Hearing loss, if CHG gets in your ears and you have a perforated eardrum. Eye injury, if CHG gets in your eyes and is not rinsed out. The CHG product catching fire. Make sure that you avoid smoking and flames after applying CHG to your skin. Do not use CHG: If you have a chlorhexidine allergy or have previously reacted to chlorhexidine. On babies younger than 2 months of age. How to use CHG solution Use CHG only as told by your health care provider, and follow the instructions on the label. Use the full amount of CHG as directed. Usually, this is one bottle. During a shower Follow these steps when using CHG solution during a shower (unless your health care provider gives you different instructions): Start the shower. Use your normal soap and shampoo to wash your face and hair. Turn off the shower or move out of the shower stream. Pour the CHG onto a clean washcloth. Do not use any type of brush or rough-edged sponge. Starting at your neck, lather your body down to your toes. Make sure you follow these instructions: If you will be having surgery, pay special attention to the part of your body where you  will be having surgery. Scrub this area for at least 1 minute. Do not use  CHG on your head or face. If the solution gets into your ears or eyes, rinse them well with water. Avoid your genital area. Avoid any areas of skin that have broken skin, cuts, or scrapes. Scrub your back and under your arms. Make sure to wash skin folds. Let the lather sit on your skin for 1-2 minutes or as long as told by your health care provider. Thoroughly rinse your entire body in the shower. Make sure that all body creases and crevices are rinsed well. Dry off with a clean towel. Do not put any substances on your body afterward--such as powder, lotion, or perfume--unless you are told to do so by your health care provider. Only use lotions that are recommended by the manufacturer. Put on clean clothes or pajamas. If it is the night before your surgery, sleep in clean sheets.  During a sponge bath Follow these steps when using CHG solution during a sponge bath (unless your health care provider gives you different instructions): Use your normal soap and shampoo to wash your face and hair. Pour the CHG onto a clean washcloth. Starting at your neck, lather your body down to your toes. Make sure you follow these instructions: If you will be having surgery, pay special attention to the part of your body where you will be having surgery. Scrub this area for at least 1 minute. Do not use CHG on your head or face. If the solution gets into your ears or eyes, rinse them well with water. Avoid your genital area. Avoid any areas of skin that have broken skin, cuts, or scrapes. Scrub your back and under your arms. Make sure to wash skin folds. Let the lather sit on your skin for 1-2 minutes or as long as told by your health care provider. Using a different clean, wet washcloth, thoroughly rinse your entire body. Make sure that all body creases and crevices are rinsed well. Dry off with a clean towel. Do not put any substances on your body afterward--such as powder, lotion, or perfume--unless you are  told to do so by your health care provider. Only use lotions that are recommended by the manufacturer. Put on clean clothes or pajamas. If it is the night before your surgery, sleep in clean sheets. How to use CHG prepackaged cloths Only use CHG cloths as told by your health care provider, and follow the instructions on the label. Use the CHG cloth on clean, dry skin. Do not use the CHG cloth on your head or face unless your health care provider tells you to. When washing with the CHG cloth: Avoid your genital area. Avoid any areas of skin that have broken skin, cuts, or scrapes. Before surgery Follow these steps when using a CHG cloth to clean before surgery (unless your health care provider gives you different instructions): Using the CHG cloth, vigorously scrub the part of your body where you will be having surgery. Scrub using a back-and-forth motion for 3 minutes. The area on your body should be completely wet with CHG when you are done scrubbing. Do not rinse. Discard the cloth and let the area air-dry. Do not put any substances on the area afterward, such as powder, lotion, or perfume. Put on clean clothes or pajamas. If it is the night before your surgery, sleep in clean sheets.  For general bathing Follow these steps when using CHG cloths for general bathing (  unless your health care provider gives you different instructions). Use a separate CHG cloth for each area of your body. Make sure you wash between any folds of skin and between your fingers and toes. Wash your body in the following order, switching to a new cloth after each step: The front of your neck, shoulders, and chest. Both of your arms, under your arms, and your hands. Your stomach and groin area, avoiding the genitals. Your right leg and foot. Your left leg and foot. The back of your neck, your back, and your buttocks. Do not rinse. Discard the cloth and let the area air-dry. Do not put any substances on your body  afterward--such as powder, lotion, or perfume--unless you are told to do so by your health care provider. Only use lotions that are recommended by the manufacturer. Put on clean clothes or pajamas. Contact a health care provider if: Your skin gets irritated after scrubbing. You have questions about using your solution or cloth. You swallow any chlorhexidine. Call your local poison control center (1-575-353-1262 in the [place] Get help right away if: Your eyes itch badly, or they become very red or swollen. Your skin itches badly and is red or swollen. Your hearing changes. You have trouble seeing. You have swelling or tingling in your mouth or throat. You have trouble breathing. These symptoms may represent a serious problem that is an emergency. Do not wait to see if the symptoms will go away. Get medical help right away. Call your local emergency services (911 in the [place] Do not drive yourself to the hospital. Summary Chlorhexidine gluconate (CHG) is a germ-killing (antiseptic) solution that is used to clean the skin. Cleaning your skin with CHG may help to lower your risk for infection. You may be given CHG to use for bathing. It may be in a bottle or in a prepackaged cloth to use on your skin. Carefully follow your health care provider's instructions and the instructions on the product label. Do not use CHG if you have a chlorhexidine allergy. Contact your health care provider if your skin gets irritated after scrubbing. This information is not intended to replace advice given to you by your health care provider. Make sure you discuss any questions you have with your health care provider. Document Revised: 02/27/2022 Document Reviewed: 01/10/2021 Elsevier Patient Education  [place].

## 2023-02-06 ENCOUNTER — Encounter (HOSPITAL_COMMUNITY): Payer: Self-pay

## 2023-02-06 ENCOUNTER — Encounter (HOSPITAL_COMMUNITY)
Admission: RE | Admit: 2023-02-06 | Discharge: 2023-02-06 | Disposition: A | Discharge status: Home / Self Care | Payer: BC Managed Care – PPO | Source: Ambulatory Visit | Attending: Orthopedic Surgery | Admitting: Orthopedic Surgery

## 2023-02-06 DIAGNOSIS — Z01818 Encounter for other preprocedural examination: Secondary | ICD-10-CM

## 2023-02-06 DIAGNOSIS — Z01812 Encounter for preprocedural laboratory examination: Secondary | ICD-10-CM | POA: Insufficient documentation

## 2023-02-06 DIAGNOSIS — R7303 Prediabetes: Secondary | ICD-10-CM | POA: Diagnosis not present

## 2023-02-06 HISTORY — DX: Prediabetes: R73.03

## 2023-02-06 LAB — CBC WITH DIFFERENTIAL/PLATELET
Abs Immature Granulocytes: 0.02 10*3/uL (ref 0.00–0.07)
Basophils Absolute: 0.1 10*3/uL (ref 0.0–0.1)
Basophils Relative: 1 %
Eosinophils Absolute: 0.1 10*3/uL (ref 0.0–0.5)
Eosinophils Relative: 1 %
HCT: 36.8 % (ref 36.0–46.0)
Hemoglobin: 12.2 g/dL (ref 12.0–15.0)
Immature Granulocytes: 0 %
Lymphocytes Relative: 28 %
Lymphs Abs: 2.3 10*3/uL (ref 0.7–4.0)
MCH: 31 pg (ref 26.0–34.0)
MCHC: 33.2 g/dL (ref 30.0–36.0)
MCV: 93.4 fL (ref 80.0–100.0)
Monocytes Absolute: 0.8 10*3/uL (ref 0.1–1.0)
Monocytes Relative: 9 %
Neutro Abs: 5.2 10*3/uL (ref 1.7–7.7)
Neutrophils Relative %: 61 %
Platelets: 283 10*3/uL (ref 150–400)
RBC: 3.94 MIL/uL (ref 3.87–5.11)
RDW: 13.7 % (ref 11.5–15.5)
WBC: 8.5 10*3/uL (ref 4.0–10.5)
nRBC: 0 % (ref 0.0–0.2)

## 2023-02-06 LAB — BASIC METABOLIC PANEL
Anion gap: 9 (ref 5–15)
BUN: 23 mg/dL — ABNORMAL HIGH (ref 6–20)
CO2: 23 mmol/L (ref 22–32)
Calcium: 9.3 mg/dL (ref 8.9–10.3)
Chloride: 105 mmol/L (ref 98–111)
Creatinine, Ser: 0.83 mg/dL (ref 0.44–1.00)
GFR, Estimated: >60 mL/min (ref >60)
Glucose, Bld: 96 mg/dL (ref 70–99)
Potassium: 3.7 mmol/L (ref 3.5–5.1)
Sodium: 137 mmol/L (ref 135–145)

## 2023-02-08 LAB — HEMOGLOBIN A1C
Hgb A1c MFr Bld: 6 % — ABNORMAL HIGH (ref 4.8–5.6)
Mean Plasma Glucose: 126 mg/dL

## 2023-02-12 NOTE — H&P (Signed)
Chief Complaint  Patient presents with           Post-op Follow-up      Left knee replaced 06/20/22; still having knee pain     Carol Dunn has anteromedial knee pain   She has had an injection for pes bursitis that has resolved  She says the knee pain is bothering her enough that she would be interested in scar tissue excision arthroscopically  Review of systems:  Review of Systems  Constitutional:  Negative for chills, fever and weight loss.  Respiratory:  Negative for shortness of breath.   Cardiovascular:  Negative for chest pain.  Neurological:  Negative for tingling.   Past Medical History:  Diagnosis Date   Headache    Hypertension    takes Lisinopril daily   Joint pain    Pre-diabetes    Primary localized osteoarthritis of right knee    Past Surgical History:  Procedure Laterality Date   ABDOMINAL HYSTERECTOMY     BACK SURGERY     CHOLECYSTECTOMY     COLONOSCOPY     JOINT REPLACEMENT  07/10/2016   right knee   NECK SURGERY     states piece of glass was in neck and had to get that out   TOTAL KNEE ARTHROPLASTY Right 07/10/2016   Procedure: TOTAL KNEE ARTHROPLASTY;  Surgeon: Elsie Saas, MD;  Location: [facility];  Service: Orthopedics;  Laterality: Right;   TOTAL KNEE ARTHROPLASTY Left 06/20/2022   Procedure: TOTAL KNEE ARTHROPLASTY;  Surgeon: Carole Civil, MD;  Location: [facility];  Service: Orthopedics;  Laterality: Left;   Family History  Problem Relation Age of Onset   Breast cancer Sister 37   Breast cancer Sister 70   Physical Exam Vitals and nursing note reviewed.  Constitutional:      Appearance: Normal appearance.  HENT:     Head: Normocephalic and atraumatic.  Eyes:     General: No scleral icterus.       Right eye: No discharge.        Left eye: No discharge.     Extraocular Movements: Extraocular movements intact.     Conjunctiva/sclera: Conjunctivae normal.     Pupils: Pupils are equal, round, and reactive to light.   Cardiovascular:     Rate and Rhythm: Normal rate.     Pulses: Normal pulses.  Skin:    General: Skin is warm and dry.     Capillary Refill: Capillary refill takes less than 2 seconds.  Neurological:     General: No focal deficit present.     Mental Status: She is alert and oriented to person, place, and time.  Psychiatric:        Mood and Affect: Mood normal.        Behavior: Behavior normal.        Thought Content: Thought content normal.        Judgment: Judgment normal.    Left knee exam  : Incision healed nicely no erythema redness or warmth no effusion It appears that she is developing scar tissue behind the patella at the junction of the quadriceps tendon where she is most tender.  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 Alternative treatment has also been reviewed, questions were encouraged and answered. The postoperative plan is also been reviewed.

## 2023-02-13 ENCOUNTER — Ambulatory Visit (HOSPITAL_COMMUNITY): Payer: BC Managed Care – PPO

## 2023-02-13 ENCOUNTER — Encounter (HOSPITAL_COMMUNITY): Payer: Self-pay | Admitting: Orthopedic Surgery

## 2023-02-13 ENCOUNTER — Other Ambulatory Visit: Payer: Self-pay

## 2023-02-13 ENCOUNTER — Ambulatory Visit (HOSPITAL_COMMUNITY)
Admission: RE | Admit: 2023-02-13 | Discharge: 2023-02-13 | Disposition: A | Discharge status: Home / Self Care | Payer: BC Managed Care – PPO | Attending: Orthopedic Surgery | Admitting: Orthopedic Surgery

## 2023-02-13 ENCOUNTER — Encounter (HOSPITAL_COMMUNITY)
Admission: RE | Disposition: A | Discharge status: Home / Self Care | Payer: Self-pay | Source: Home / Self Care | Attending: Orthopedic Surgery

## 2023-02-13 DIAGNOSIS — F17200 Nicotine dependence, unspecified, uncomplicated: Secondary | ICD-10-CM | POA: Insufficient documentation

## 2023-02-13 DIAGNOSIS — M24662 Ankylosis, left knee: Secondary | ICD-10-CM | POA: Insufficient documentation

## 2023-02-13 DIAGNOSIS — I10 Essential (primary) hypertension: Secondary | ICD-10-CM | POA: Insufficient documentation

## 2023-02-13 DIAGNOSIS — M25862 Other specified joint disorders, left knee: Secondary | ICD-10-CM | POA: Insufficient documentation

## 2023-02-13 HISTORY — PX: KNEE ARTHROSCOPY: SHX127

## 2023-02-13 LAB — GLUCOSE, CAPILLARY
Glucose-Capillary: 69 mg/dL — ABNORMAL LOW (ref 70–99)
Glucose-Capillary: 87 mg/dL (ref 70–99)
Glucose-Capillary: 116 mg/dL — ABNORMAL HIGH (ref 70–99)

## 2023-02-13 SURGERY — ARTHROSCOPY, KNEE
Anesthesia: General | Site: Knee | Laterality: Left

## 2023-02-13 MED ORDER — DEXTROSE 50 % IV SOLN
INTRAVENOUS | Status: AC
  Administered 2023-02-13: 25 mL via INTRAVENOUS
  Filled 2023-02-13: qty 50

## 2023-02-13 MED ORDER — DEXAMETHASONE SODIUM PHOSPHATE 10 MG/ML IJ SOLN
INTRAMUSCULAR | Status: DC | PRN
  Administered 2023-02-13: 10 mg via INTRAVENOUS

## 2023-02-13 MED ORDER — ONDANSETRON HCL 4 MG/2ML IJ SOLN
INTRAMUSCULAR | Status: AC
  Filled 2023-02-13: qty 2

## 2023-02-13 MED ORDER — SODIUM CHLORIDE 0.9 % IR SOLN
Status: DC | PRN
  Administered 2023-02-13 (×6): 3000 mL

## 2023-02-13 MED ORDER — OXYCODONE HCL 5 MG/5ML PO SOLN: 5.0000 mg | Freq: Once | ORAL | Status: DC | PRN

## 2023-02-13 MED ORDER — FENTANYL CITRATE (PF) 100 MCG/2ML IJ SOLN
INTRAMUSCULAR | Status: DC | PRN
  Administered 2023-02-13: 50 ug via INTRAVENOUS
  Administered 2023-02-13: 25 ug via INTRAVENOUS
  Administered 2023-02-13: 50 ug via INTRAVENOUS
  Administered 2023-02-13: 25 ug via INTRAVENOUS
  Administered 2023-02-13: 50 ug via INTRAVENOUS

## 2023-02-13 MED ORDER — CEFAZOLIN SODIUM-DEXTROSE 2-4 GM/100ML-% IV SOLN
INTRAVENOUS | Status: AC
  Filled 2023-02-13: qty 100

## 2023-02-13 MED ORDER — OXYCODONE HCL 5 MG PO TABS: 5.0000 mg | ORAL_TABLET | Freq: Once | ORAL | Status: DC | PRN

## 2023-02-13 MED ORDER — HYDROCODONE-ACETAMINOPHEN 5-325 MG PO TABS
1.0000 | ORAL_TABLET | Freq: Once | ORAL | Status: AC
  Administered 2023-02-13: 1 via ORAL
  Filled 2023-02-13: qty 1

## 2023-02-13 MED ORDER — MIDAZOLAM HCL 2 MG/2ML IJ SOLN
INTRAMUSCULAR | Status: AC
  Filled 2023-02-13: qty 2

## 2023-02-13 MED ORDER — FENTANYL CITRATE (PF) 100 MCG/2ML IJ SOLN
INTRAMUSCULAR | Status: AC
  Filled 2023-02-13: qty 2

## 2023-02-13 MED ORDER — DEXTROSE 50 % IV SOLN: 25.0000 mL | Freq: Once | INTRAVENOUS | Status: AC

## 2023-02-13 MED ORDER — PROPOFOL 10 MG/ML IV BOLUS
INTRAVENOUS | Status: DC | PRN
  Administered 2023-02-13: 40 mg via INTRAVENOUS
  Administered 2023-02-13: 160 mg via INTRAVENOUS

## 2023-02-13 MED ORDER — LIDOCAINE HCL (PF) 2 % IJ SOLN
INTRAMUSCULAR | Status: AC
  Filled 2023-02-13: qty 5

## 2023-02-13 MED ORDER — DEXAMETHASONE SODIUM PHOSPHATE 10 MG/ML IJ SOLN
INTRAMUSCULAR | Status: AC
  Filled 2023-02-13: qty 1

## 2023-02-13 MED ORDER — BUPIVACAINE-EPINEPHRINE (PF) 0.5% -1:200000 IJ SOLN
INTRAMUSCULAR | Status: AC
  Filled 2023-02-13: qty 30

## 2023-02-13 MED ORDER — HYDROCODONE-ACETAMINOPHEN 5-325 MG PO TABS: 1.0000 | ORAL_TABLET | ORAL | 0 refills | Status: AC | PRN

## 2023-02-13 MED ORDER — CEFAZOLIN SODIUM-DEXTROSE 2-4 GM/100ML-% IV SOLN
2.0000 g | INTRAVENOUS | Status: AC
  Administered 2023-02-13: 2 g via INTRAVENOUS

## 2023-02-13 MED ORDER — EPINEPHRINE PF 1 MG/ML IJ SOLN
INTRAMUSCULAR | Status: AC
  Filled 2023-02-13: qty 8

## 2023-02-13 MED ORDER — LIDOCAINE HCL (CARDIAC) PF 100 MG/5ML IV SOSY
PREFILLED_SYRINGE | INTRAVENOUS | Status: DC | PRN
  Administered 2023-02-13: 80 mg via INTRATRACHEAL

## 2023-02-13 MED ORDER — CHLORHEXIDINE GLUCONATE 0.12 % MT SOLN
15.0000 mL | Freq: Once | OROMUCOSAL | Status: AC
  Administered 2023-02-13: 15 mL via OROMUCOSAL

## 2023-02-13 MED ORDER — SODIUM CHLORIDE 0.9 % IR SOLN
Status: DC | PRN
  Administered 2023-02-13: 1000 mL

## 2023-02-13 MED ORDER — SUCCINYLCHOLINE CHLORIDE 200 MG/10ML IV SOSY
PREFILLED_SYRINGE | INTRAVENOUS | Status: AC
  Filled 2023-02-13: qty 10

## 2023-02-13 MED ORDER — BUPIVACAINE-EPINEPHRINE (PF) 0.5% -1:200000 IJ SOLN
INTRAMUSCULAR | Status: DC | PRN
  Administered 2023-02-13: 30 mL

## 2023-02-13 MED ORDER — PHENYLEPHRINE HCL (PRESSORS) 10 MG/ML IV SOLN
INTRAVENOUS | Status: DC | PRN
  Administered 2023-02-13: 80 ug via INTRAVENOUS
  Administered 2023-02-13: 40 ug via INTRAVENOUS
  Administered 2023-02-13: 80 ug via INTRAVENOUS

## 2023-02-13 MED ORDER — ROCURONIUM BROMIDE 100 MG/10ML IV SOLN
INTRAVENOUS | Status: DC | PRN
  Administered 2023-02-13: 20 mg via INTRAVENOUS

## 2023-02-13 MED ORDER — MIDAZOLAM HCL 5 MG/5ML IJ SOLN
INTRAMUSCULAR | Status: DC | PRN
  Administered 2023-02-13: 2 mg via INTRAVENOUS

## 2023-02-13 MED ORDER — PROPOFOL 10 MG/ML IV BOLUS
INTRAVENOUS | Status: AC
  Filled 2023-02-13: qty 20

## 2023-02-13 MED ORDER — SUGAMMADEX SODIUM 200 MG/2ML IV SOLN
INTRAVENOUS | Status: DC | PRN
  Administered 2023-02-13: 200 mg via INTRAVENOUS

## 2023-02-13 MED ORDER — ONDANSETRON HCL 4 MG/2ML IJ SOLN
INTRAMUSCULAR | Status: DC | PRN
  Administered 2023-02-13: 4 mg via INTRAVENOUS

## 2023-02-13 MED ORDER — DEXTROSE 50 % IV SOLN
INTRAVENOUS | Status: DC | PRN
  Administered 2023-02-13: 10 mL via INTRAVENOUS

## 2023-02-13 MED ORDER — LACTATED RINGERS IV SOLN: INTRAVENOUS | Status: DC

## 2023-02-13 MED ORDER — SUCCINYLCHOLINE CHLORIDE 200 MG/10ML IV SOSY
PREFILLED_SYRINGE | INTRAVENOUS | Status: DC | PRN
  Administered 2023-02-13: 100 mg via INTRAVENOUS

## 2023-02-13 MED ORDER — KETOROLAC TROMETHAMINE 30 MG/ML IJ SOLN
INTRAMUSCULAR | Status: AC
  Filled 2023-02-13: qty 1

## 2023-02-13 MED ORDER — FENTANYL CITRATE PF 50 MCG/ML IJ SOSY
25.0000 ug | PREFILLED_SYRINGE | INTRAMUSCULAR | Status: DC | PRN
  Administered 2023-02-13: 50 ug via INTRAVENOUS
  Filled 2023-02-13: qty 1

## 2023-02-13 MED ORDER — KETOROLAC TROMETHAMINE 30 MG/ML IJ SOLN
15.0000 mg | Freq: Once | INTRAMUSCULAR | Status: AC
  Administered 2023-02-13: 15 mg via INTRAVENOUS

## 2023-02-13 MED ORDER — ORAL CARE MOUTH RINSE: 15.0000 mL | Freq: Once | OROMUCOSAL | Status: AC

## 2023-02-13 MED ORDER — ONDANSETRON HCL 4 MG/2ML IJ SOLN
4.0000 mg | Freq: Once | INTRAMUSCULAR | Status: AC | PRN
  Administered 2023-02-13: 4 mg via INTRAVENOUS
  Filled 2023-02-13: qty 2

## 2023-02-13 SURGICAL SUPPLY — 55 items
APL PRP 2.5X2.5 STRL LF DISP (MISCELLANEOUS) ×1
APL PRP STRL LF DISP 70% ISPRP (MISCELLANEOUS) ×1
APPLICATOR CHLORAPREP 1.5 (MISCELLANEOUS) IMPLANT
BLADE EXCALIBUR 4.0X13 (MISCELLANEOUS) IMPLANT
BLADE SURG SZ10 CARB STEEL (BLADE) IMPLANT
BLADE SURG SZ11 CARB STEEL (BLADE) ×1 IMPLANT
BNDG CMPR STD VLCR NS LF 5.8X6 (GAUZE/BANDAGES/DRESSINGS) ×1
BNDG ELASTIC 6X5.8 VLCR NS LF (GAUZE/BANDAGES/DRESSINGS) ×1 IMPLANT
CHLORAPREP W/TINT 26 (MISCELLANEOUS) ×1 IMPLANT
CLOTH BEACON ORANGE TIMEOUT ST (SAFETY) ×1 IMPLANT
COVER LIGHT HANDLE STERIS (MISCELLANEOUS) IMPLANT
DECANTER SPIKE VIAL GLASS SM (MISCELLANEOUS) ×2 IMPLANT
FIBERSTICK 2 (SUTURE) IMPLANT
GAUZE SPONGE 4X4 12PLY STRL (GAUZE/BANDAGES/DRESSINGS) IMPLANT
GAUZE SPONGE 4X4 12PLY STRL LF (GAUZE/BANDAGES/DRESSINGS) ×1 IMPLANT
GAUZE XEROFORM 5X9 LF (GAUZE/BANDAGES/DRESSINGS) ×1 IMPLANT
GLOVE BIOGEL PI IND STRL 7.0 (GLOVE) ×2 IMPLANT
GLOVE SS N UNI LF 8.5 STRL (GLOVE) ×1 IMPLANT
GLOVE SURG POLYISO LF SZ8 (GLOVE) ×1 IMPLANT
GOWN STRL REUS W/TWL LRG LVL3 (GOWN DISPOSABLE) ×1 IMPLANT
GOWN STRL REUS W/TWL XL LVL3 (GOWN DISPOSABLE) ×1 IMPLANT
IV NS IRRIG 3000ML ARTHROMATIC (IV SOLUTION) ×2 IMPLANT
KIT BLADEGUARD II DBL (SET/KITS/TRAYS/PACK) ×1 IMPLANT
KIT TURNOVER CYSTO (KITS) ×1 IMPLANT
MANIFOLD NEPTUNE II (INSTRUMENTS) ×1 IMPLANT
MARKER SKIN DUAL TIP RULER LAB (MISCELLANEOUS) ×1 IMPLANT
NDL HYPO 18GX1.5 BLUNT FILL (NEEDLE) ×1 IMPLANT
NDL HYPO 21X1.5 SAFETY (NEEDLE) ×1 IMPLANT
NEEDLE HYPO 18GX1.5 BLUNT FILL (NEEDLE) ×1 IMPLANT
NEEDLE HYPO 21X1.5 SAFETY (NEEDLE) ×1 IMPLANT
NS IRRIG 1000ML POUR BTL (IV SOLUTION) ×1 IMPLANT
PACK ARTHRO LIMB DRAPE STRL (MISCELLANEOUS) ×1 IMPLANT
PAD ABD 5X9 TENDERSORB (GAUZE/BANDAGES/DRESSINGS) ×1 IMPLANT
PAD ARMBOARD 7.5X6 YLW CONV (MISCELLANEOUS) ×1 IMPLANT
PAD COLD SHLDR SM WRAP-ON (PAD) IMPLANT
PADDING CAST COTTON 6X4 STRL (CAST SUPPLIES) ×1 IMPLANT
PENCIL HANDSWITCHING (ELECTRODE) IMPLANT
PORT APPOLLO RF 90DEGREE MULTI (SURGICAL WAND) IMPLANT
SET ARTHROSCOPY INST (INSTRUMENTS) ×1 IMPLANT
SET BASIN LINEN APH (SET/KITS/TRAYS/PACK) ×1 IMPLANT
SPONGE T-LAP 18X18 ~~LOC~~+RFID (SPONGE) IMPLANT
STRIP CLOSURE SKIN 1/2X4 (GAUZE/BANDAGES/DRESSINGS) IMPLANT
SUT ETHILON 3 0 FSL (SUTURE) ×1 IMPLANT
SUT LASSO 45 DEGREE (SUTURE) IMPLANT
SUT MNCRL 0 VIOLET CTX 36 (SUTURE) IMPLANT
SUT MON AB 2-0 CT1 36 (SUTURE) IMPLANT
SUT MONOCRYL 0 CTX 36 (SUTURE) ×1
SUT VIC AB 1 CT1 27 (SUTURE) ×1
SUT VIC AB 1 CT1 27XBRD ANTBC (SUTURE) IMPLANT
SYR 10ML LL (SYRINGE) ×1 IMPLANT
SYR 30ML LL (SYRINGE) ×1 IMPLANT
SYR CONTROL 10ML LL (SYRINGE) ×1 IMPLANT
TUBE CONNECTING 12X1/4 (SUCTIONS) ×4 IMPLANT
TUBING IN/OUT FLOW W/MAIN PUMP (TUBING) ×1 IMPLANT
YANKAUER SUCT BULB TIP NO VENT (SUCTIONS) IMPLANT

## 2023-02-13 NOTE — Brief Op Note (Signed)
02/13/2023  12:20 PM  PATIENT:  Carol Dunn  57 y.o. 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   Delay start of Pharmacological VTE agent (>24hrs) due to surgical blood loss or risk of bleeding: not applicable

## 2023-02-13 NOTE — Anesthesia Preprocedure Evaluation (Signed)
Anesthesia Evaluation  Patient identified by MRN, date of birth, ID band Patient awake    Reviewed: Allergy & Precautions, H&P , NPO status , Patient's Chart, lab work & pertinent test results, reviewed documented beta blocker date and time   Airway Mallampati: II  TM Distance: >3 FB Neck ROM: full    Dental no notable dental hx.    Pulmonary neg pulmonary ROS, Current Smoker   Pulmonary exam normal breath sounds clear to auscultation       Cardiovascular Exercise Tolerance: Good hypertension, negative cardio ROS  Rhythm:regular Rate:Normal     Neuro/Psych  Headaches  Neuromuscular disease negative neurological ROS  negative psych ROS   GI/Hepatic negative GI ROS, Neg liver ROS,,,  Endo/Other  negative endocrine ROS    Renal/GU negative Renal ROS  negative genitourinary   Musculoskeletal   Abdominal   Peds  Hematology negative hematology ROS (+)   Anesthesia Other Findings   Reproductive/Obstetrics negative OB ROS                             Anesthesia Physical Anesthesia Plan  ASA: 2  Anesthesia Plan: General and General LMA   Post-op Pain Management:    Induction:   PONV Risk Score and Plan: Ondansetron  Airway Management Planned:   Additional Equipment:   Intra-op Plan:   Post-operative Plan:   Informed Consent: I have reviewed the patients History and Physical, chart, labs and discussed the procedure including the risks, benefits and alternatives for the proposed anesthesia with the patient or authorized representative who has indicated his/her understanding and acceptance.     Dental Advisory Given  Plan Discussed with: CRNA  Anesthesia Plan Comments:        Anesthesia Quick Evaluation

## 2023-02-13 NOTE — Interval H&P Note (Signed)
History and Physical Interval Note:  02/13/2023 10:09 AM  Carol Dunn  has presented today for surgery, with the diagnosis of Left patellar clunk syndrome.  The various methods of treatment have been discussed with the patient and family. After consideration of risks, benefits and other options for treatment, the patient has consented to  Procedure(s): ARTHROSCOPY KNEE DEBRIDEMENT SCAR TISSUE (Left) as a surgical intervention.  The patient's history has been reviewed, patient examined, no change in status, stable for surgery.  I have reviewed the patient's chart and labs.  Questions were answered to the patient's satisfaction.     Arther Abbott

## 2023-02-13 NOTE — Op Note (Signed)
02/13/2023  12:20 PM  PATIENT:  Carol Dunn  57 y.o. 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  Implant otherwise look normal.  Patellar tracking was normal.  A mini arthrotomy was performed to assess for possible quadriceps tendon violation.  The medial 1 and half centimeters of the quadriceps tendon was incised as the scar tissue was being incised this was repaired with a #2 FiberWire arthroscopically through remaining incision.  Procedure was done as follows  The patient was seen in the preop area the surgical site was confirmed as left knee and marked.  The patient was taken the operating room for general anesthesia.  After sterile prep and drape and timeout a standard lateral portal was established and the scope was placed into the joint diagnostic arthroscopy was performed  The first area visually evaluated was the suprapatellar region it was clean however on the medial side and the medial gutter was extensive scar tissue which tracked down to the joint and then towards the notch area  A spinal needle was used to perform a separate medial portal and the shaver and ArthroCare wand were used to debride this area.  In the course of this debridement as the scar tissue tract towards the quadriceps tendon the lateral 1 and half centimeters of quadriceps tendon appear to have been violated  So with the assistance of a spinal needle I made a minimal incision in the skin to get down to the quadriceps I did not see an absolute full-thickness defect but from under surface view it looks like it was dilated so a #2 FiberWire was used to repair this area and this was done arthroscopically with  the assistance of a spinal needle and a 45 degree suture passer  This was tied arthroscopically visualizing closure of the defect  There is no change in patella position  The wound was irrigated and closed with 0 Vicryl and 2-0 Monocryl and Steri-Strips.  Portal sites closed with 3-0 nylon.  All incisions and joint injected with 30 cc Marcaine.  Sterile dressings applied.  Cryo/Cuff applied.  Patient extubated taken to recovery in stable condition  Postop plan full weightbearing as tolerated  Immediate range of motion as tolerated  Note preop range of motion under anesthesia 3 degrees hyperextension flexion 110 degreees   SURGEON:  Surgeon(s) and Role:    * Carole Civil, MD - Primary  PHYSICIAN ASSISTANT:   ASSISTANTS: none   ANESTHESIA:   general  EBL:  minimal  BLOOD ADMINISTERED:none  DRAINS: none   LOCAL MEDICATIONS USED:  MARCAINE     SPECIMEN:  No Specimen  DISPOSITION OF SPECIMEN:  N/A  COUNTS:  YES  TOURNIQUET:  * No tourniquets in log *  DICTATION: .Dragon Dictation  PLAN OF CARE: Discharge to home after PACU  PATIENT DISPOSITION:  PACU - hemodynamically stable.   Delay start of Pharmacological VTE agent (>24hrs) due to surgical blood loss or risk of bleeding: not applicable

## 2023-02-13 NOTE — Anesthesia Procedure Notes (Signed)
Procedure Name: LMA Insertion Date/Time: 02/13/2023 10:35 AM  Performed by: Myna Bright, CRNAPre-anesthesia Checklist: Patient identified, Emergency Drugs available, Suction available, Patient being monitored and Timeout performed Patient Re-evaluated:Patient Re-evaluated prior to induction Oxygen Delivery Method: Circle system utilized Preoxygenation: Pre-oxygenation with 100% oxygen Induction Type: IV induction Ventilation: Mask ventilation without difficulty LMA: LMA inserted LMA Size: 4.0 Tube type: Oral Number of attempts: 1 Placement Confirmation: positive ETCO2 and breath sounds checked- equal and bilateral Tube secured with: Tape Dental Injury: Teeth and Oropharynx as per pre-operative assessment  Comments: Mammie Russian, SRNA placed LMA

## 2023-02-13 NOTE — Transfer of Care (Signed)
Immediate Anesthesia Transfer of Care Note  Patient: Carol Dunn  Procedure(s) Performed: ARTHROSCOPY KNEE DEBRIDEMENT SCAR TISSUE (Left: Knee)  Patient Location: PACU  Anesthesia Type:General  Level of Consciousness: drowsy and responds to stimulation  Airway & Oxygen Therapy: Patient Spontanous Breathing and Patient connected to nasal cannula oxygen  Post-op Assessment: Report given to RN, Post -op Vital signs reviewed and stable, and Patient moving all extremities X 4  Post vital signs: Reviewed and stable  Last Vitals:  Vitals Value Taken Time  BP 119/77 02/13/23 1230  Temp    Pulse 82 02/13/23 1231  Resp 22 02/13/23 1231  SpO2 95 % 02/13/23 1231  Vitals shown include unvalidated device data.  Last Pain:  Vitals:   02/13/23 0859  TempSrc: Oral  PainSc: 0-No pain         Complications: Pt had aspiration event requiring intubation. Upon extubation SPO2 stable 97% on 2L [place], while in PACU able to downtitrate to room air with oxygen saturation greater than 97%.

## 2023-02-13 NOTE — Anesthesia Procedure Notes (Signed)
Procedure Name: Intubation Date/Time: 02/13/2023 10:40 AM  Performed by: Myna Bright, CRNAPre-anesthesia Checklist: Patient identified, Emergency Drugs available, Suction available and Patient being monitored Induction Type: Inhalational induction with existing ETT, Cricoid Pressure applied and Rapid sequence Ventilation: Mask ventilation with difficulty Laryngoscope Size: Mac and 3 Grade View: Grade II Tube type: Oral Tube size: 7.5 mm Number of attempts: 1 Airway Equipment and Method: Stylet Placement Confirmation: ETT inserted through vocal cords under direct vision, positive ETCO2 and breath sounds checked- equal and bilateral Secured at: 21 cm Tube secured with: Tape Dental Injury: Teeth and Oropharynx as per pre-operative assessment  Comments: LMA removed d/t patient emesis (clear yellow fluid). Masked briefly with some difficulty. Suctioned oropharynx. DL x 1, good view, atraumatic oral intubation. ETT deep suctioned with small amount of clear yellow fluid retrieved. VSS. - Barrett Henle, CRNA

## 2023-02-15 ENCOUNTER — Telehealth: Payer: Self-pay | Admitting: Orthopedic Surgery

## 2023-02-15 NOTE — Anesthesia Postprocedure Evaluation (Signed)
Anesthesia Post Note  Patient: Carol Dunn  Procedure(s) Performed: ARTHROSCOPY KNEE DEBRIDEMENT SCAR TISSUE (Left: Knee)  Patient location during evaluation: Phase II Anesthesia Type: General Level of consciousness: awake Pain management: pain level controlled Vital Signs Assessment: post-procedure vital signs reviewed and stable Respiratory status: spontaneous breathing and respiratory function stable Cardiovascular status: blood pressure returned to baseline and stable Postop Assessment: no headache and no apparent nausea or vomiting Anesthetic complications: no Comments: Late entry   No notable events documented.   Last Vitals:  Vitals:   02/13/23 1325 02/13/23 1328  BP: 122/80 124/82  Pulse: 89 81  Resp: 20 16  Temp:  (!) 36.3 C  SpO2: 99%     Last Pain:  Vitals:   02/14/23 0938  TempSrc:   PainSc: [place]

## 2023-02-15 NOTE — Telephone Encounter (Signed)
Patient lvm checking the status of her forms, her job is needing them.  Pt's #'s [phone] or [phone].  I'll be happy to call the patient, please advise status.  They were put in the folder for [place] on 02/01/23.

## 2023-02-15 NOTE — Telephone Encounter (Signed)
I have forms in hand and will bring to [facility] 02/16/23.  Patient asks [place] to fax to her employer at [phone], [place].

## 2023-02-16 DIAGNOSIS — M25862 Other specified joint disorders, left knee: Secondary | ICD-10-CM | POA: Insufficient documentation

## 2023-02-20 ENCOUNTER — Encounter (HOSPITAL_COMMUNITY): Payer: Self-pay | Admitting: Orthopedic Surgery

## 2023-02-22 ENCOUNTER — Encounter: Payer: Self-pay | Admitting: Orthopedic Surgery

## 2023-02-22 ENCOUNTER — Ambulatory Visit (INDEPENDENT_AMBULATORY_CARE_PROVIDER_SITE_OTHER): Payer: BC Managed Care – PPO | Admitting: Orthopedic Surgery

## 2023-02-22 DIAGNOSIS — M25862 Other specified joint disorders, left knee: Secondary | ICD-10-CM

## 2023-02-22 DIAGNOSIS — Z96652 Presence of left artificial knee joint: Secondary | ICD-10-CM

## 2023-02-22 NOTE — Progress Notes (Addendum)
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   Implant otherwise look normal.  Patellar tracking was normal.   A mini arthrotomy was performed to assess for possible quadriceps tendon violation.  The medial 1 and half centimeters of the quadriceps tendon was incised as the scar tissue was being incised this was repaired with a #2 FiberWire arthroscopically through remaining incision.   Incision looks clean.  Extensor mechanism is intact.  Pain is much better and the medial pain has all but resolved  Patient will advance activities as tolerated follow-up as needed  She will return to work on the [employer] light duty she will do light duty for 6 weeks

## 2023-02-22 NOTE — Patient Instructions (Addendum)
RETURN TO WORK ON [employer] 21 light duty week s

## 2023-02-27 ENCOUNTER — Other Ambulatory Visit: Payer: Self-pay | Admitting: Orthopedic Surgery

## 2023-02-27 DIAGNOSIS — M62838 Other muscle spasm: Secondary | ICD-10-CM

## 2023-02-27 DIAGNOSIS — Z96652 Presence of left artificial knee joint: Secondary | ICD-10-CM

## 2023-07-05 ENCOUNTER — Telehealth: Payer: Self-pay | Admitting: Orthopedic Surgery

## 2023-07-05 NOTE — Telephone Encounter (Signed)
Dr. Mort Sawyers pt - pt lvm requesting a note for work stating that she can be excused from any activities involving kneeling.  Home # [phone]/Cell # [phone]

## 2023-07-05 NOTE — Telephone Encounter (Signed)
I called the patient to let her know I got her message and routed it.  She will be at work tomorrow, we can call her on her work # [phone]

## 2023-07-06 ENCOUNTER — Encounter: Payer: Self-pay | Admitting: Orthopedic Surgery

## 2023-07-06 NOTE — Telephone Encounter (Signed)
Called the patient multiple times to let her know the note is ready, no answer.

## 2023-07-06 NOTE — Telephone Encounter (Signed)
yes

## 2023-07-24 ENCOUNTER — Other Ambulatory Visit: Payer: Self-pay | Admitting: Family

## 2023-07-24 DIAGNOSIS — Z1231 Encounter for screening mammogram for malignant neoplasm of breast: Secondary | ICD-10-CM

## 2023-08-30 ENCOUNTER — Ambulatory Visit
Admission: RE | Admit: 2023-08-30 | Discharge: 2023-08-30 | Disposition: A | Discharge status: Home / Self Care | Payer: BC Managed Care – PPO | Source: Ambulatory Visit | Attending: Family | Admitting: Family

## 2023-08-30 DIAGNOSIS — Z1231 Encounter for screening mammogram for malignant neoplasm of breast: Secondary | ICD-10-CM | POA: Insufficient documentation

## 2024-08-04 ENCOUNTER — Other Ambulatory Visit: Payer: Self-pay | Admitting: Family

## 2024-08-04 DIAGNOSIS — Z1231 Encounter for screening mammogram for malignant neoplasm of breast: Secondary | ICD-10-CM

## 2024-09-01 ENCOUNTER — Ambulatory Visit
Admission: RE | Admit: 2024-09-01 | Discharge: 2024-09-01 | Disposition: A | Discharge status: Home / Self Care | Payer: Self-pay | Source: Ambulatory Visit | Attending: Family | Admitting: Family

## 2024-09-01 DIAGNOSIS — Z1231 Encounter for screening mammogram for malignant neoplasm of breast: Secondary | ICD-10-CM | POA: Diagnosis present
